# Patient Record
Sex: Male | Born: 1950
Health system: Southern US, Community
[De-identification: ages and names within clinical notes are randomized; demographics above are authoritative.]

## PROBLEM LIST (undated history)

## (undated) DIAGNOSIS — Z86010 Personal history of colonic polyps: Secondary | ICD-10-CM

## (undated) DIAGNOSIS — Z8719 Personal history of other diseases of the digestive system: Secondary | ICD-10-CM

## (undated) DIAGNOSIS — J45909 Unspecified asthma, uncomplicated: Secondary | ICD-10-CM

## (undated) DIAGNOSIS — M5030 Other cervical disc degeneration, unspecified cervical region: Secondary | ICD-10-CM

## (undated) DIAGNOSIS — M47812 Spondylosis without myelopathy or radiculopathy, cervical region: Secondary | ICD-10-CM

## (undated) DIAGNOSIS — C61 Malignant neoplasm of prostate: Secondary | ICD-10-CM

## (undated) DIAGNOSIS — D649 Anemia, unspecified: Secondary | ICD-10-CM

## (undated) HISTORY — PX: NO PAST SURGERIES: SHX2092

## (undated) HISTORY — PX: UPPER GI ENDOSCOPY: SHX6162

## (undated) HISTORY — PX: COLONOSCOPY: SHX174

---

## 2012-07-31 ENCOUNTER — Emergency Department (HOSPITAL_COMMUNITY): Payer: No Typology Code available for payment source

## 2012-07-31 ENCOUNTER — Encounter (HOSPITAL_COMMUNITY): Payer: Self-pay | Admitting: *Deleted

## 2012-07-31 ENCOUNTER — Emergency Department (HOSPITAL_COMMUNITY)
Admission: EM | Admit: 2012-07-31 | Discharge: 2012-08-01 | Disposition: A | Discharge status: Home / Self Care | Payer: No Typology Code available for payment source | Attending: Emergency Medicine | Admitting: Emergency Medicine

## 2012-07-31 DIAGNOSIS — R209 Unspecified disturbances of skin sensation: Secondary | ICD-10-CM | POA: Insufficient documentation

## 2012-07-31 DIAGNOSIS — Y92410 Unspecified street and highway as the place of occurrence of the external cause: Secondary | ICD-10-CM | POA: Insufficient documentation

## 2012-07-31 DIAGNOSIS — M542 Cervicalgia: Secondary | ICD-10-CM

## 2012-07-31 DIAGNOSIS — S0993XA Unspecified injury of face, initial encounter: Secondary | ICD-10-CM | POA: Insufficient documentation

## 2012-07-31 DIAGNOSIS — Y9389 Activity, other specified: Secondary | ICD-10-CM | POA: Insufficient documentation

## 2012-07-31 MED ORDER — CYCLOBENZAPRINE HCL 10 MG PO TABS: 10.0000 mg | ORAL_TABLET | Freq: Two times a day (BID) | ORAL | Status: DC | PRN
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
## 2012-08-01 NOTE — ED Provider Notes (Signed)
Medical screening examination/treatment/procedure(s) were performed by non-physician practitioner and as supervising physician I was immediately available for consultation/collaboration.  Flint Melter, MD 08/01/12 [phone]

## 2012-09-03 ENCOUNTER — Encounter (INDEPENDENT_AMBULATORY_CARE_PROVIDER_SITE_OTHER): Payer: Self-pay | Admitting: Surgery

## 2012-09-05 ENCOUNTER — Telehealth (INDEPENDENT_AMBULATORY_CARE_PROVIDER_SITE_OTHER): Payer: Self-pay | Admitting: Surgery

## 2012-09-05 ENCOUNTER — Encounter (INDEPENDENT_AMBULATORY_CARE_PROVIDER_SITE_OTHER): Payer: Self-pay | Admitting: Surgery

## 2012-09-05 ENCOUNTER — Ambulatory Visit (INDEPENDENT_AMBULATORY_CARE_PROVIDER_SITE_OTHER): Payer: BC Managed Care – PPO | Admitting: Surgery

## 2012-09-05 VITALS — BP 116/80 | HR 68 | Temp 97.2°F | Resp 14 | Ht 69.0 in | Wt 168.2 lb

## 2012-09-05 DIAGNOSIS — K4090 Unilateral inguinal hernia, without obstruction or gangrene, not specified as recurrent: Secondary | ICD-10-CM

## 2012-09-05 NOTE — Telephone Encounter (Signed)
Pt made aware financial responsibility.

## 2012-09-05 NOTE — Progress Notes (Signed)
Patient ID: Joseph Kane, male   DOB: 1950/03/09, 62 y.o.   MRN: [id number]  Chief Complaint  Patient presents with  . New Evaluation    acutr rt groin hernia    HPI Joseph Kane is a 62 y.o. male.  Referred by Dr. Mirna Mires for evaluation of right inguinal hernia   HPI This is a very healthy 62 year old male who presents with several months of an enlarging bulge in his right groin. The patient works at an [employer] and does a lot of heavy lifting. This causes some mild discomfort. He has fashioned a homemade truss to try to put pressure on this area to help keep it in. He has no pain when the area is reduced. He denies any obstructive symptoms. He presents now for surgical evaluation.  History reviewed. No pertinent past medical history.  History reviewed. No pertinent past surgical history.  Family History  Problem Relation Age of Onset  . Stroke Mother   . Cancer Father     throat    Social History History  Substance Use Topics  . Smoking status: Former Smoker    Quit date: 09/06/1982  . Smokeless tobacco: Never Used  . Alcohol Use: No    Allergies  Allergen Reactions  . Penicillins Other (See Comments)    Patient Blacks out    Current Outpatient Prescriptions  Medication Sig Dispense Refill  . cyclobenzaprine (FLEXERIL) 10 MG tablet Take 1 tablet (10 mg total) by mouth 2 (two) times daily as needed for muscle spasms.  10 tablet  0  . fish oil-omega-3 fatty acids 1000 MG capsule Take 1 g by mouth every other day.      . Multiple Vitamin (MULTIVITAMIN WITH MINERALS) TABS Take 1 tablet by mouth every other day.      Marland Kitchen OVER THE COUNTER MEDICATION Take 1 each by mouth daily. Gummy vitamin for prostate health      . OVER THE COUNTER MEDICATION        No current facility-administered medications for this visit.    Review of Systems Review of Systems  Constitutional: Negative for fever, chills and unexpected weight change.  HENT: Negative for hearing loss,  congestion, sore throat, trouble swallowing and voice change.   Eyes: Negative for visual disturbance.  Respiratory: Negative for cough and wheezing.   Cardiovascular: Negative for chest pain, palpitations and leg swelling.  Gastrointestinal: Negative for nausea, vomiting, abdominal pain, diarrhea, constipation, blood in stool, abdominal distention, anal bleeding and rectal pain.  Genitourinary: Positive for scrotal swelling. Negative for hematuria and difficulty urinating.  Musculoskeletal: Negative for arthralgias.  Skin: Negative for rash and wound.  Neurological: Negative for seizures, syncope, weakness and headaches.  Hematological: Negative for adenopathy. Does not bruise/bleed easily.  Psychiatric/Behavioral: Negative for confusion.    Blood pressure 116/80, pulse 68, temperature 97.2 F (36.2 C), temperature source Temporal, resp. rate 14, height 5\' 9"  (1.753 m), weight 168 lb 3.2 oz (76.295 kg).  Physical Exam Physical Exam WDWN in NAD HEENT:  EOMI, sclera anicteric Neck:  No masses, no thyromegaly Lungs:  CTA bilaterally; normal respiratory effort CV:  Regular rate and rhythm; no murmurs Abd:  +bowel sounds, soft, non-tender, no masses GU:  Bilateral descended testes; large right inguinal hernia extending down to scrotum; no sign of left inguinal hernia Ext:  Well-perfused; no edema Skin:  Warm, dry; no sign of jaundice  Data Reviewed none  Assessment    Right inguinal hernia - reducible  Plan    Right inguinal hernia repair with mesh.  The surgical procedure has been discussed with the patient.  Potential risks, benefits, alternative treatments, and expected outcomes have been explained.  All of the patient's questions at this time have been answered.  The likelihood of reaching the patient's treatment goal is good.  The patient understand the proposed surgical procedure and wishes to proceed.         Ladaja Yusupov K. 09/05/2012, 10:30 AM

## 2013-01-02 ENCOUNTER — Ambulatory Visit (INDEPENDENT_AMBULATORY_CARE_PROVIDER_SITE_OTHER): Payer: BC Managed Care – PPO | Admitting: Surgery

## 2013-01-02 ENCOUNTER — Encounter (INDEPENDENT_AMBULATORY_CARE_PROVIDER_SITE_OTHER): Payer: Self-pay | Admitting: Surgery

## 2013-01-02 ENCOUNTER — Encounter (INDEPENDENT_AMBULATORY_CARE_PROVIDER_SITE_OTHER): Payer: Self-pay

## 2013-01-02 VITALS — BP 138/82 | HR 84 | Resp 20 | Ht 69.0 in | Wt 179.2 lb

## 2013-01-02 DIAGNOSIS — K4090 Unilateral inguinal hernia, without obstruction or gangrene, not specified as recurrent: Secondary | ICD-10-CM

## 2013-01-02 NOTE — Progress Notes (Signed)
HPI  Joseph Kane is a 62 y.o. male. Referred by Dr. Mirna Mires for evaluation of right inguinal hernia   HPI  This is a very healthy 62 year old male who presents with several months of an enlarging bulge in his right groin. The patient works at an [employer] and does a lot of heavy lifting. This causes some mild discomfort. He has fashioned a homemade truss to try to put pressure on this area to help keep it in. He has no pain when the area is reduced. He denies any obstructive symptoms. He presents now for surgical evaluation.   PMH:  none  History reviewed. No pertinent past surgical history.   Family History   Problem  Relation  Age of Onset   .  Stroke  Mother    .  Cancer  Father      throat   Social History  History   Substance Use Topics   .  Smoking status:  Former Smoker     Quit date:  09/06/1982   .  Smokeless tobacco:  Never Used   .  Alcohol Use:  No    Allergies   Allergen  Reactions   .  Penicillins  Other (See Comments)     Patient Blacks out    Current Outpatient Prescriptions   Medication  Sig  Dispense  Refill   .  cyclobenzaprine (FLEXERIL) 10 MG tablet  Take 1 tablet (10 mg total) by mouth 2 (two) times daily as needed for muscle spasms.  10 tablet  0   .  fish oil-omega-3 fatty acids 1000 MG capsule  Take 1 g by mouth every other day.     .  Multiple Vitamin (MULTIVITAMIN WITH MINERALS) TABS  Take 1 tablet by mouth every other day.     Marland Kitchen  OVER THE COUNTER MEDICATION  Take 1 each by mouth daily. Gummy vitamin for prostate health     .  OVER THE COUNTER MEDICATION       No current facility-administered medications for this visit.   Review of Systems  Review of Systems  Constitutional: Negative for fever, chills and unexpected weight change.  HENT: Negative for hearing loss, congestion, sore throat, trouble swallowing and voice change.  Eyes: Negative for visual disturbance.  Respiratory: Negative for cough and wheezing.  Cardiovascular: Negative  for chest pain, palpitations and leg swelling.  Gastrointestinal: Negative for nausea, vomiting, abdominal pain, diarrhea, constipation, blood in stool, abdominal distention, anal bleeding and rectal pain.  Genitourinary: Positive for scrotal swelling. Negative for hematuria and difficulty urinating.  Musculoskeletal: Negative for arthralgias.  Skin: Negative for rash and wound.  Neurological: Negative for seizures, syncope, weakness and headaches.  Hematological: Negative for adenopathy. Does not bruise/bleed easily.  Psychiatric/Behavioral: Negative for confusion.  Blood pressure 116/80, pulse 68, temperature 97.2 F (36.2 C), temperature source Temporal, resp. rate 14, height 5\' 9"  (1.753 m), weight 168 lb 3.2 oz (76.295 kg).  Physical Exam  Physical Exam  WDWN in NAD  HEENT: EOMI, sclera anicteric  Neck: No masses, no thyromegaly  Lungs: CTA bilaterally; normal respiratory effort  CV: Regular rate and rhythm; no murmurs  Abd: +bowel sounds, soft, non-tender, no masses  GU: Bilateral descended testes; large right inguinal hernia extending down to scrotum; no sign of left inguinal hernia  Ext: Well-perfused; no edema  Skin: Warm, dry; no sign of jaundice  Data Reviewed  none  Assessment  Right inguinal hernia - reducible  Plan  Right inguinal  hernia repair with mesh. The surgical procedure has been discussed with the patient. Potential risks, benefits, alternative treatments, and expected outcomes have been explained. All of the patient's questions at this time have been answered. The likelihood of reaching the patient's treatment goal is good. The patient understand the proposed surgical procedure and wishes to proceed.    Joseph Kane. Joseph Skains, MD, [facility] Surgery  General/ Trauma Surgery  01/02/2013 9:44 AM

## 2013-01-12 ENCOUNTER — Encounter (HOSPITAL_COMMUNITY): Payer: Self-pay

## 2013-01-12 ENCOUNTER — Encounter (HOSPITAL_COMMUNITY)
Admission: RE | Admit: 2013-01-12 | Discharge: 2013-01-12 | Disposition: A | Discharge status: Home / Self Care | Payer: BC Managed Care – PPO | Source: Ambulatory Visit | Attending: Surgery | Admitting: Surgery

## 2013-01-12 HISTORY — DX: Unspecified asthma, uncomplicated: J45.909

## 2013-01-12 LAB — CBC
HCT: 39.1 % (ref 39.0–52.0)
Hemoglobin: 13 g/dL (ref 13.0–17.0)
WBC: 5.1 10*3/uL (ref 4.0–10.5)

## 2013-01-12 NOTE — Pre-Procedure Instructions (Signed)
Joseph Kane  01/12/2013   Your procedure is scheduled on:  Wednesday, January 14, 2013  Report to [facility] Short Stay (use Main Entrance "A'') at 1:30 PM.  Call this number if you have problems the morning of surgery: [phone]   Remember:   Do not eat food or drink liquids after midnight.   Take these medicines the morning of surgery with A SIP OF WATER: None Stop taking Aspirin, Multiple Vitamin (MULTIVITAMIN WITH MINERALS) TABS and herbal medications (fish oil-omega-3 fatty acids 1000 MG capsule)  Do not take any NSAIDs ie: Ibuprofen, Advil, Naproxen or any medication containing Aspirin.  Do not wear jewelry, make-up or nail polish.  Do not wear lotions, powders, or perfumes. You may NOT wear deodorant.  Do not shave 48 hours prior to surgery. Men may shave face and neck.  Do not bring valuables to the hospital.  [facility] is not responsible for any belongings or valuables.               Contacts, dentures or bridgework may not be worn into surgery.  Leave suitcase in the car. After surgery it may be brought to your room.  For patients admitted to the hospital, discharge time is determined by your treatment team.               Patients discharged the day of surgery will not be allowed to drive home.  Name and phone number of your driver:   Special Instructions: Shower using CHG 2 nights before surgery and the night before surgery.  If you shower the day of surgery use CHG.  Use special wash - you have one bottle of CHG for all showers.  You should use approximately 1/3 of the bottle for each shower.   Please read over the following fact sheets that you were given: Pain Booklet, Coughing and Deep Breathing and Surgical Site Infection Prevention

## 2013-01-12 NOTE — Progress Notes (Signed)
Pt denies SOB, chest pain, and being under the care of a cardiologist. Pt PCP,  Dr. Loleta Chance of [facility] made aware of pt positive STOP-BangAassessment Tool results. Stress test and EKG results request from PCP.

## 2013-01-12 NOTE — Progress Notes (Signed)
01/12/13 1129  OBSTRUCTIVE SLEEP APNEA  Have you ever been diagnosed with sleep apnea through a sleep study? No  Do you snore loudly (loud enough to be heard through closed doors)?  1  Do you often feel tired, fatigued, or sleepy during the daytime? 0  Has anyone observed you stop breathing during your sleep? 1  Do you have, or are you being treated for high blood pressure? 0  BMI more than 35 kg/m2? 0  Age over 61 years old? 1  Neck circumference greater than 40 cm/18 inches? 0  Gender: 1  Obstructive Sleep Apnea Score 4  Score 4 or greater  Results sent to PCP

## 2013-01-13 ENCOUNTER — Encounter (HOSPITAL_COMMUNITY): Payer: Self-pay | Admitting: Pharmacy Technician

## 2013-01-13 MED ORDER — CHLORHEXIDINE GLUCONATE 4 % EX LIQD: 1.0000 "application " | Freq: Once | CUTANEOUS | Status: DC

## 2013-01-13 MED ORDER — VANCOMYCIN HCL IN DEXTROSE 1-5 GM/200ML-% IV SOLN
1000.0000 mg | INTRAVENOUS | Status: AC
  Administered 2013-01-14: 1000 mg via INTRAVENOUS
  Filled 2013-01-13: qty 200

## 2013-01-13 NOTE — Progress Notes (Signed)
Anesthesia Chart Review:  Patient is a 62 year old male scheduled for right IHR on 01/14/13 by Dr. [name].    History includes former smoker, asthma. OSA screening score was 4 or greater.  PCP is Dr. Loleta Chance of the [facility] who referred patient to [facility] for treatment of his inguinal hernia.  Patient denied chest pain and SOB during his PAT visit.  He had PFTs, cardiopulmonary exercise test on 09/14/10 at the [facility].  Spirometry and diffusion were WNL. By notes, baseline EKG showed NSR without ST changes noted at peak HR, occasional PACs and PVCs noted. Functional status was mildly reduced. Cardiovascular status was documented as impaired. Peak VO2 = 79% predicted, and patient was asymptomatic other than leg fatigue so aggressive medical and exercise therapy were recommended.  Overall risk assessment for high risk surgery was LOW.    Dr. Adaline Sill office does not actually have an EKG copy on file at their office.  Although it appears his stress test was overall low risk, I will order an EKG for baseline since CV status was marked as "impaired" and PVCs were documented.  Preoperative CBC noted.  (He had a normal BMET and liver profile on 08/21/12 at the [facility].)  I have reviewed stress test report with Dr,. Hodierne. Patient has no history of known DM, CHF, MI, HTN.  He denied CV symptoms, and it appears that the overall impression of his cardiopulmonary exercise test over two years ago showed low risk for a high risk surgery (which inguinal hernia repair is not).  If his EKG shows no worrisome findings then I would anticipate that he could proceed as planned. Further evaluation by his assigned anesthesiologist on the day of surgery.    Velna Ochs [facility] Short Stay Center/Anesthesiology Phone [phone] 01/13/2013 10:46 AM

## 2013-01-14 ENCOUNTER — Encounter (HOSPITAL_COMMUNITY)
Admission: RE | Disposition: A | Discharge status: Home / Self Care | Payer: Self-pay | Source: Ambulatory Visit | Attending: Surgery

## 2013-01-14 ENCOUNTER — Ambulatory Visit (HOSPITAL_COMMUNITY): Payer: BC Managed Care – PPO | Admitting: Certified Registered"

## 2013-01-14 ENCOUNTER — Encounter (HOSPITAL_COMMUNITY): Payer: BC Managed Care – PPO | Admitting: Vascular Surgery

## 2013-01-14 ENCOUNTER — Ambulatory Visit (HOSPITAL_COMMUNITY)
Admission: RE | Admit: 2013-01-14 | Discharge: 2013-01-14 | Disposition: A | Discharge status: Home / Self Care | Payer: BC Managed Care – PPO | Source: Ambulatory Visit | Attending: Surgery | Admitting: Surgery

## 2013-01-14 DIAGNOSIS — Z01812 Encounter for preprocedural laboratory examination: Secondary | ICD-10-CM | POA: Insufficient documentation

## 2013-01-14 DIAGNOSIS — K4090 Unilateral inguinal hernia, without obstruction or gangrene, not specified as recurrent: Secondary | ICD-10-CM

## 2013-01-14 DIAGNOSIS — Z87891 Personal history of nicotine dependence: Secondary | ICD-10-CM | POA: Insufficient documentation

## 2013-01-14 HISTORY — PX: INGUINAL HERNIA REPAIR: SHX194

## 2013-01-14 HISTORY — PX: INSERTION OF MESH: SHX5868

## 2013-01-14 SURGERY — REPAIR, HERNIA, INGUINAL, ADULT
Anesthesia: General | Site: Groin | Laterality: Right

## 2013-01-14 MED ORDER — BUPIVACAINE-EPINEPHRINE (PF) 0.25% -1:200000 IJ SOLN
INTRAMUSCULAR | Status: AC
  Filled 2013-01-14: qty 30

## 2013-01-14 MED ORDER — MIDAZOLAM HCL 5 MG/5ML IJ SOLN
INTRAMUSCULAR | Status: DC | PRN
  Administered 2013-01-14: 2 mg via INTRAVENOUS

## 2013-01-14 MED ORDER — NEOSTIGMINE METHYLSULFATE 1 MG/ML IJ SOLN
INTRAMUSCULAR | Status: DC | PRN
  Administered 2013-01-14: 4 mg via INTRAVENOUS

## 2013-01-14 MED ORDER — MEPERIDINE HCL 25 MG/ML IJ SOLN: 6.2500 mg | INTRAMUSCULAR | Status: DC | PRN

## 2013-01-14 MED ORDER — FENTANYL CITRATE 0.05 MG/ML IJ SOLN
INTRAMUSCULAR | Status: DC | PRN
  Administered 2013-01-14: 100 ug via INTRAVENOUS
  Administered 2013-01-14: 50 ug via INTRAVENOUS

## 2013-01-14 MED ORDER — OXYCODONE-ACETAMINOPHEN 5-325 MG PO TABS: 1.0000 | ORAL_TABLET | ORAL | Status: DC | PRN

## 2013-01-14 MED ORDER — FENTANYL CITRATE 0.05 MG/ML IJ SOLN
INTRAMUSCULAR | Status: AC
  Administered 2013-01-14: 50 ug via INTRAVENOUS
  Filled 2013-01-14: qty 2

## 2013-01-14 MED ORDER — FENTANYL CITRATE 0.05 MG/ML IJ SOLN
25.0000 ug | INTRAMUSCULAR | Status: DC | PRN
  Administered 2013-01-14: 50 ug via INTRAVENOUS

## 2013-01-14 MED ORDER — PROPOFOL 10 MG/ML IV BOLUS
INTRAVENOUS | Status: DC | PRN
  Administered 2013-01-14: 20 mg via INTRAVENOUS
  Administered 2013-01-14: 200 mg via INTRAVENOUS

## 2013-01-14 MED ORDER — LACTATED RINGERS IV SOLN
INTRAVENOUS | Status: DC | PRN
  Administered 2013-01-14: 14:00:00 via INTRAVENOUS

## 2013-01-14 MED ORDER — LIDOCAINE HCL (CARDIAC) 20 MG/ML IV SOLN
INTRAVENOUS | Status: DC | PRN
  Administered 2013-01-14: 60 mg via INTRAVENOUS

## 2013-01-14 MED ORDER — BUPIVACAINE-EPINEPHRINE 0.25% -1:200000 IJ SOLN
INTRAMUSCULAR | Status: DC | PRN
  Administered 2013-01-14: 30 mL

## 2013-01-14 MED ORDER — ONDANSETRON HCL 4 MG/2ML IJ SOLN
INTRAMUSCULAR | Status: DC | PRN
  Administered 2013-01-14: 4 mg via INTRAVENOUS

## 2013-01-14 MED ORDER — GLYCOPYRROLATE 0.2 MG/ML IJ SOLN
INTRAMUSCULAR | Status: DC | PRN
  Administered 2013-01-14: 0.6 mg via INTRAVENOUS

## 2013-01-14 MED ORDER — ROCURONIUM BROMIDE 100 MG/10ML IV SOLN
INTRAVENOUS | Status: DC | PRN
  Administered 2013-01-14: 35 mg via INTRAVENOUS

## 2013-01-14 MED ORDER — PROMETHAZINE HCL 25 MG/ML IJ SOLN: 6.2500 mg | INTRAMUSCULAR | Status: DC | PRN

## 2013-01-14 MED ORDER — ONDANSETRON HCL 4 MG/2ML IJ SOLN: 4.0000 mg | INTRAMUSCULAR | Status: DC | PRN

## 2013-01-14 MED ORDER — KETOROLAC TROMETHAMINE 30 MG/ML IJ SOLN
INTRAMUSCULAR | Status: DC | PRN
  Administered 2013-01-14: 30 mg via INTRAVENOUS

## 2013-01-14 MED ORDER — MORPHINE SULFATE 2 MG/ML IJ SOLN: 2.0000 mg | INTRAMUSCULAR | Status: DC | PRN

## 2013-01-14 MED ORDER — LACTATED RINGERS IV SOLN
INTRAVENOUS | Status: DC
  Administered 2013-01-14: 13:00:00 via INTRAVENOUS

## 2013-01-14 MED ORDER — 0.9 % SODIUM CHLORIDE (POUR BTL) OPTIME
TOPICAL | Status: DC | PRN
  Administered 2013-01-14: 1000 mL

## 2013-01-14 MED ORDER — LACTATED RINGERS IV SOLN: INTRAVENOUS | Status: DC

## 2013-01-14 SURGICAL SUPPLY — 49 items
BENZOIN TINCTURE PRP APPL 2/3 (GAUZE/BANDAGES/DRESSINGS) ×2 IMPLANT
BLADE SURG 15 STRL LF DISP TIS (BLADE) ×1 IMPLANT
BLADE SURG 15 STRL SS (BLADE) ×1
BLADE SURG ROTATE 9660 (MISCELLANEOUS) ×2 IMPLANT
CHLORAPREP W/TINT 26ML (MISCELLANEOUS) ×2 IMPLANT
COVER SURGICAL LIGHT HANDLE (MISCELLANEOUS) ×2 IMPLANT
DECANTER SPIKE VIAL GLASS SM (MISCELLANEOUS) ×2 IMPLANT
DRAIN PENROSE 1/2X12 LTX STRL (WOUND CARE) ×2 IMPLANT
DRAPE LAPAROSCOPIC ABDOMINAL (DRAPES) IMPLANT
DRAPE LAPAROTOMY TRNSV 102X78 (DRAPE) ×2 IMPLANT
DRAPE UTILITY 15X26 W/TAPE STR (DRAPE) ×4 IMPLANT
DRSG TEGADERM 4X4.75 (GAUZE/BANDAGES/DRESSINGS) ×2 IMPLANT
ELECT CAUTERY BLADE 6.4 (BLADE) ×2 IMPLANT
ELECT REM PT RETURN 9FT ADLT (ELECTROSURGICAL) ×2
ELECTRODE REM PT RTRN 9FT ADLT (ELECTROSURGICAL) ×1 IMPLANT
GAUZE SPONGE 4X4 16PLY XRAY LF (GAUZE/BANDAGES/DRESSINGS) ×2 IMPLANT
GLOVE BIO SURGEON STRL SZ7 (GLOVE) ×2 IMPLANT
GLOVE BIOGEL PI IND STRL 7.0 (GLOVE) ×1 IMPLANT
GLOVE BIOGEL PI IND STRL 7.5 (GLOVE) ×1 IMPLANT
GLOVE BIOGEL PI INDICATOR 7.0 (GLOVE) ×1
GLOVE BIOGEL PI INDICATOR 7.5 (GLOVE) ×1
GLOVE SURG SS PI 7.0 STRL IVOR (GLOVE) ×2 IMPLANT
GOWN STRL NON-REIN LRG LVL3 (GOWN DISPOSABLE) ×6 IMPLANT
KIT BASIN OR (CUSTOM PROCEDURE TRAY) ×2 IMPLANT
KIT ROOM TURNOVER OR (KITS) ×2 IMPLANT
MESH PARIETEX PROGRIP RIGHT (Mesh General) ×2 IMPLANT
NEEDLE HYPO 25GX1X1/2 BEV (NEEDLE) ×2 IMPLANT
NS IRRIG 1000ML POUR BTL (IV SOLUTION) ×2 IMPLANT
PACK SURGICAL SETUP 50X90 (CUSTOM PROCEDURE TRAY) ×2 IMPLANT
PAD ARMBOARD 7.5X6 YLW CONV (MISCELLANEOUS) ×2 IMPLANT
PENCIL BUTTON HOLSTER BLD 10FT (ELECTRODE) ×2 IMPLANT
SPECIMEN JAR SMALL (MISCELLANEOUS) IMPLANT
SPONGE GAUZE 4X4 12PLY (GAUZE/BANDAGES/DRESSINGS) ×2 IMPLANT
SPONGE INTESTINAL PEANUT (DISPOSABLE) ×2 IMPLANT
STRIP CLOSURE SKIN 1/2X4 (GAUZE/BANDAGES/DRESSINGS) ×2 IMPLANT
SUT MNCRL AB 4-0 PS2 18 (SUTURE) ×2 IMPLANT
SUT PDS AB 0 CT 36 (SUTURE) IMPLANT
SUT PROLENE 2 0 SH DA (SUTURE) ×4 IMPLANT
SUT SILK 2 0 SH (SUTURE) IMPLANT
SUT SILK 3 0 (SUTURE)
SUT SILK 3-0 18XBRD TIE 12 (SUTURE) IMPLANT
SUT VIC AB 0 CT2 27 (SUTURE) ×2 IMPLANT
SUT VIC AB 2-0 SH 27 (SUTURE) ×1
SUT VIC AB 2-0 SH 27X BRD (SUTURE) ×1 IMPLANT
SUT VIC AB 3-0 SH 27 (SUTURE) ×1
SUT VIC AB 3-0 SH 27XBRD (SUTURE) ×1 IMPLANT
SYR CONTROL 10ML LL (SYRINGE) ×2 IMPLANT
TOWEL OR 17X24 6PK STRL BLUE (TOWEL DISPOSABLE) ×2 IMPLANT
TOWEL OR 17X26 10 PK STRL BLUE (TOWEL DISPOSABLE) ×2 IMPLANT

## 2013-01-14 NOTE — Progress Notes (Signed)
Report given to Ashrith Sagan rn as caregiver 

## 2013-01-14 NOTE — Anesthesia Procedure Notes (Addendum)
Procedure Name: Intubation Date/Time: 01/14/2013 2:27 PM Performed by: Ellin Goodie Pre-anesthesia Checklist: Patient identified, Emergency Drugs available, Suction available, Patient being monitored and Timeout performed Patient Re-evaluated:Patient Re-evaluated prior to inductionOxygen Delivery Method: Circle system utilized Preoxygenation: Pre-oxygenation with 100% oxygen Intubation Type: IV induction Ventilation: Mask ventilation without difficulty and Oral airway inserted - appropriate to patient size Laryngoscope Size: Mac and 3 Grade View: Grade I Tube type: Oral Tube size: 7.5 mm Number of attempts: 1 Airway Equipment and Method: Stylet Placement Confirmation: ETT inserted through vocal cords under direct vision,  positive ETCO2 and breath sounds checked- equal and bilateral Secured at: 22 cm Tube secured with: Tape Dental Injury: Teeth and Oropharynx as per pre-operative assessment  Comments: Easy atraumatic induction and intubation with MAC 3 blade.  Dr. Acey Lav verified placement of ETT.  Carlynn Herald, CRNA

## 2013-01-14 NOTE — H&P (View-Only) (Signed)
HPI  Joseph Kane is a 62 y.o. male. Referred by Dr. Gerald Hill for evaluation of right inguinal hernia   HPI  This is a very healthy 62-year-old male who presents with several months of an enlarging bulge in his right groin. The patient works at an [employer] and does a lot of heavy lifting. This causes some mild discomfort. He has fashioned a homemade truss to try to put pressure on this area to help keep it in. He has no pain when the area is reduced. He denies any obstructive symptoms. He presents now for surgical evaluation.   PMH:  none  History reviewed. No pertinent past surgical history.   Family History   Problem  Relation  Age of Onset   .  Stroke  Mother    .  Cancer  Father      throat   Social History  History   Substance Use Topics   .  Smoking status:  Former Smoker     Quit date:  09/06/1982   .  Smokeless tobacco:  Never Used   .  Alcohol Use:  No    Allergies   Allergen  Reactions   .  Penicillins  Other (See Comments)     Patient Blacks out    Current Outpatient Prescriptions   Medication  Sig  Dispense  Refill   .  cyclobenzaprine (FLEXERIL) 10 MG tablet  Take 1 tablet (10 mg total) by mouth 2 (two) times daily as needed for muscle spasms.  10 tablet  0   .  fish oil-omega-3 fatty acids 1000 MG capsule  Take 1 g by mouth every other day.     .  Multiple Vitamin (MULTIVITAMIN WITH MINERALS) TABS  Take 1 tablet by mouth every other day.     .  OVER THE COUNTER MEDICATION  Take 1 each by mouth daily. Gummy vitamin for prostate health     .  OVER THE COUNTER MEDICATION       No current facility-administered medications for this visit.   Review of Systems  Review of Systems  Constitutional: Negative for fever, chills and unexpected weight change.  HENT: Negative for hearing loss, congestion, sore throat, trouble swallowing and voice change.  Eyes: Negative for visual disturbance.  Respiratory: Negative for cough and wheezing.  Cardiovascular: Negative  for chest pain, palpitations and leg swelling.  Gastrointestinal: Negative for nausea, vomiting, abdominal pain, diarrhea, constipation, blood in stool, abdominal distention, anal bleeding and rectal pain.  Genitourinary: Positive for scrotal swelling. Negative for hematuria and difficulty urinating.  Musculoskeletal: Negative for arthralgias.  Skin: Negative for rash and wound.  Neurological: Negative for seizures, syncope, weakness and headaches.  Hematological: Negative for adenopathy. Does not bruise/bleed easily.  Psychiatric/Behavioral: Negative for confusion.  Blood pressure 116/80, pulse 68, temperature 97.2 F (36.2 C), temperature source Temporal, resp. rate 14, height 5' 9" (1.753 m), weight 168 lb 3.2 oz (76.295 kg).  Physical Exam  Physical Exam  WDWN in NAD  HEENT: EOMI, sclera anicteric  Neck: No masses, no thyromegaly  Lungs: CTA bilaterally; normal respiratory effort  CV: Regular rate and rhythm; no murmurs  Abd: +bowel sounds, soft, non-tender, no masses  GU: Bilateral descended testes; large right inguinal hernia extending down to scrotum; no sign of left inguinal hernia  Ext: Well-perfused; no edema  Skin: Warm, dry; no sign of jaundice  Data Reviewed  none  Assessment  Right inguinal hernia - reducible  Plan  Right inguinal   hernia repair with mesh. The surgical procedure has been discussed with the patient. Potential risks, benefits, alternative treatments, and expected outcomes have been explained. All of the patient's questions at this time have been answered. The likelihood of reaching the patient's treatment goal is good. The patient understand the proposed surgical procedure and wishes to proceed.    [name] [name], MD, FACS [facility]  General/ Trauma Surgery  01/02/2013 9:44 AM  

## 2013-01-14 NOTE — Preoperative (Signed)
Beta Blockers   Reason not to administer Beta Blockers:Not Applicable 

## 2013-01-14 NOTE — Anesthesia Postprocedure Evaluation (Signed)
  Anesthesia Post-op Note  Patient: Joseph Kane  Procedure(s) Performed: Procedure(s) (LRB): HERNIA REPAIR INGUINAL ADULT (Right) INSERTION OF MESH (Right)  Patient Location: PACU  Anesthesia Type: General  Level of Consciousness: awake and alert   Airway and Oxygen Therapy: Patient Spontanous Breathing  Post-op Pain: mild  Post-op Assessment: Post-op Vital signs reviewed, Patient's Cardiovascular Status Stable, Respiratory Function Stable, Patent Airway and No signs of Nausea or vomiting  Last Vitals:  Filed Vitals:   01/14/13 1530  BP: 131/79  Pulse: 73  Temp: 36 C  Resp: 17    Post-op Vital Signs: stable   Complications: No apparent anesthesia complications

## 2013-01-14 NOTE — Anesthesia Preprocedure Evaluation (Addendum)
Anesthesia Evaluation  Patient identified by MRN, date of birth, ID band Patient awake    Reviewed: Allergy & Precautions, H&P , NPO status , Patient's Chart, lab work & pertinent test results, reviewed documented beta blocker date and time   Airway Mallampati: I TM Distance: >3 FB Neck ROM: Full    Dental no notable dental hx. (+) Teeth Intact and Dental Advisory Given   Pulmonary former smoker,  breath sounds clear to auscultation  Pulmonary exam normal       Cardiovascular negative cardio ROS  Rhythm:Regular Rate:Normal     Neuro/Psych negative neurological ROS  negative psych ROS   GI/Hepatic negative GI ROS, Neg liver ROS,   Endo/Other  negative endocrine ROS  Renal/GU negative Renal ROS  negative genitourinary   Musculoskeletal negative musculoskeletal ROS (+)   Abdominal (+)  Abdomen: soft. Bowel sounds: normal.  Peds negative pediatric ROS (+)  Hematology negative hematology ROS (+)   Anesthesia Other Findings   Reproductive/Obstetrics negative OB ROS                        Anesthesia Physical Anesthesia Plan  ASA: I  Anesthesia Plan: General   Post-op Pain Management:    Induction: Intravenous  Airway Management Planned: LMA and Oral ETT  Additional Equipment:   Intra-op Plan:   Post-operative Plan: Extubation in OR  Informed Consent: I have reviewed the patients History and Physical, chart, labs and discussed the procedure including the risks, benefits and alternatives for the proposed anesthesia with the patient or authorized representative who has indicated his/her understanding and acceptance.   Dental advisory given  Plan Discussed with: CRNA  Anesthesia Plan Comments:         Anesthesia Quick Evaluation

## 2013-01-14 NOTE — Transfer of Care (Signed)
Immediate Anesthesia Transfer of Care Note  Patient: Joseph Kane  Procedure(s) Performed: Procedure(s): HERNIA REPAIR INGUINAL ADULT (Right) INSERTION OF MESH (Right)  Patient Location: PACU  Anesthesia Type:General  Level of Consciousness: awake, alert  and sedated  Airway & Oxygen Therapy: Patient connected to face mask oxygen  Post-op Assessment: Report given to PACU RN  Post vital signs: stable  Complications: No apparent anesthesia complications

## 2013-01-14 NOTE — Interval H&P Note (Signed)
History and Physical Interval Note:  01/14/2013 1:40 PM  Joseph Kane  has presented today for surgery, with the diagnosis of large right inguinal hernia   The various methods of treatment have been discussed with the patient and family. After consideration of risks, benefits and other options for treatment, the patient has consented to  Procedure(s): HERNIA REPAIR INGUINAL ADULT (Right) INSERTION OF MESH (Right) as a surgical intervention .  The patient's history has been reviewed, patient examined, no change in status, stable for surgery.  I have reviewed the patient's chart and labs.  Questions were answered to the patient's satisfaction.     Donja Tipping K.

## 2013-01-14 NOTE — Op Note (Signed)
Hernia, Open, Procedure Note  Indications: The patient presented with a history of a right, reducible inguinal hernia.    Pre-operative Diagnosis: right reducible inguinal hernia Post-operative Diagnosis: same  Surgeon: Wynona Luna.   Assistants: none  Anesthesia: General endotracheal anesthesia  ASA Class: 1  Procedure Details  The patient was seen again in the Holding Room. The risks, benefits, complications, treatment options, and expected outcomes were discussed with the patient. The possibilities of reaction to medication, pulmonary aspiration, perforation of viscus, bleeding, recurrent infection, the need for additional procedures, and development of a complication requiring transfusion or further operation were discussed with the patient and/or family. The likelihood of success in repairing the hernia and returning the patient to their previous functional status is good.  There was concurrence with the proposed plan, and informed consent was obtained. The site of surgery was properly noted/marked. The patient was taken to the Operating Room, identified as Sriyan Cutting, and the procedure verified as right inguinal hernia repair. A Time Out was held and the above information confirmed.  The patient was placed in the supine position and underwent induction of anesthesia. The lower abdomen and groin was prepped with Chloraprep and draped in the standard fashion, and 0.25% Marcaine with epinephrine was used to anesthetize the skin over the mid-portion of the inguinal canal. An oblique incision was made. Dissection was carried down through the subcutaneous tissue with cautery to the external oblique fascia.  We opened the external oblique fascia along the direction of its fibers to the external ring.  The spermatic cord was circumferentially dissected bluntly and retracted with a Penrose drain.  The ilioinguinal nerve was identified and preserved.  The floor of the inguinal canal was  inspected and was intact.  We skeletonized the spermatic cord and reduced a large indirect hernia sac.  We used a right-sided Progrip mesh which was inserted and deployed across the floor of the inguinal canal. The mesh was tucked underneath the external oblique fascia laterally.  The flap of the mesh was closed around the spermatic cord to recreate the internal inguinal ring.  The mesh was secured to the pubic tubercle with 0 Vicryl.  The external oblique fascia was reapproximated with 2-0 Vicryl.  3-0 Vicryl was used to close the subcutaneous tissues and 4-0 Monocryl was used to close the skin in subcuticular fashion.  Benzoin and steri-strips were used to seal the incision.  A clean dressing was applied.  The patient was then extubated and brought to the recovery room in stable condition.  All sponge, instrument, and needle counts were correct prior to closure and at the conclusion of the case.   Estimated Blood Loss: Minimal                 Complications: None; patient tolerated the procedure well.         Disposition: PACU - hemodynamically stable.         Condition: stable  [name]. [name], MD, [facility]  General/ Trauma Surgery  01/14/2013 3:30 PM

## 2013-01-16 ENCOUNTER — Encounter (HOSPITAL_COMMUNITY): Payer: Self-pay | Admitting: Surgery

## 2013-01-28 ENCOUNTER — Ambulatory Visit (INDEPENDENT_AMBULATORY_CARE_PROVIDER_SITE_OTHER): Payer: BC Managed Care – PPO | Admitting: Surgery

## 2013-01-28 ENCOUNTER — Encounter (INDEPENDENT_AMBULATORY_CARE_PROVIDER_SITE_OTHER): Payer: Self-pay | Admitting: Surgery

## 2013-01-28 VITALS — BP 125/83 | HR 68 | Temp 98.2°F | Resp 18 | Ht 69.0 in | Wt 177.6 lb

## 2013-01-28 DIAGNOSIS — K4090 Unilateral inguinal hernia, without obstruction or gangrene, not specified as recurrent: Secondary | ICD-10-CM

## 2013-01-28 NOTE — Progress Notes (Signed)
Status post right inguinal hernia repair with mesh on 01/14/13.  The patient had a large indirect hernia. He is doing quite well. Minimal swelling. His incision is healing well no sign of infection. No sign of recurrent hernia. He should continue limiting his level of activity for 2 more weeks. Follow up as needed.  [name]. [name], MD, [facility]  General/ Trauma Surgery  01/28/2013 2:17 PM

## 2013-02-03 ENCOUNTER — Encounter (INDEPENDENT_AMBULATORY_CARE_PROVIDER_SITE_OTHER): Payer: Self-pay | Admitting: Surgery

## 2013-02-03 ENCOUNTER — Other Ambulatory Visit (INDEPENDENT_AMBULATORY_CARE_PROVIDER_SITE_OTHER): Payer: Self-pay | Admitting: Surgery

## 2013-02-03 ENCOUNTER — Telehealth (INDEPENDENT_AMBULATORY_CARE_PROVIDER_SITE_OTHER): Payer: Self-pay | Admitting: *Deleted

## 2013-02-03 ENCOUNTER — Telehealth (INDEPENDENT_AMBULATORY_CARE_PROVIDER_SITE_OTHER): Payer: Self-pay | Admitting: General Surgery

## 2013-02-03 MED ORDER — OXYCODONE-ACETAMINOPHEN 5-325 MG PO TABS: 1.0000 | ORAL_TABLET | ORAL | Status: DC | PRN

## 2013-02-03 NOTE — Telephone Encounter (Signed)
Called patient wife to let her know that I have a Rx for her husband at the check in desk for percocet 5-325

## 2013-02-03 NOTE — Telephone Encounter (Signed)
Patient's wife called asking for a refill of pain medication.  She states that since patient has been back to work he has periods of increasing pain.  She denies that patient has any symptoms or signs of infection.  Explained that I would send a message to Dr. [name] to ask his opinion on this then we will let her know.  Wife states understanding at this time.

## 2015-07-10 ENCOUNTER — Encounter (HOSPITAL_COMMUNITY): Payer: Self-pay

## 2015-07-10 ENCOUNTER — Emergency Department (HOSPITAL_COMMUNITY): Payer: No Typology Code available for payment source

## 2015-07-10 ENCOUNTER — Emergency Department (HOSPITAL_COMMUNITY)
Admission: EM | Admit: 2015-07-10 | Discharge: 2015-07-10 | Disposition: A | Discharge status: Home / Self Care | Payer: No Typology Code available for payment source | Attending: Emergency Medicine | Admitting: Emergency Medicine

## 2015-07-10 DIAGNOSIS — Y92410 Unspecified street and highway as the place of occurrence of the external cause: Secondary | ICD-10-CM | POA: Insufficient documentation

## 2015-07-10 DIAGNOSIS — M7918 Myalgia, other site: Secondary | ICD-10-CM

## 2015-07-10 DIAGNOSIS — M545 Low back pain: Secondary | ICD-10-CM | POA: Insufficient documentation

## 2015-07-10 DIAGNOSIS — Z87891 Personal history of nicotine dependence: Secondary | ICD-10-CM | POA: Diagnosis not present

## 2015-07-10 DIAGNOSIS — Y9389 Activity, other specified: Secondary | ICD-10-CM | POA: Diagnosis not present

## 2015-07-10 DIAGNOSIS — Z79899 Other long term (current) drug therapy: Secondary | ICD-10-CM | POA: Insufficient documentation

## 2015-07-10 DIAGNOSIS — Z7982 Long term (current) use of aspirin: Secondary | ICD-10-CM | POA: Diagnosis not present

## 2015-07-10 DIAGNOSIS — Y999 Unspecified external cause status: Secondary | ICD-10-CM | POA: Insufficient documentation

## 2015-07-10 DIAGNOSIS — J45909 Unspecified asthma, uncomplicated: Secondary | ICD-10-CM | POA: Diagnosis not present

## 2015-07-10 MED ORDER — METHOCARBAMOL 500 MG PO TABS: 500.0000 mg | ORAL_TABLET | Freq: Two times a day (BID) | ORAL | Status: DC

## 2015-07-10 MED ORDER — NAPROXEN 250 MG PO TABS
500.0000 mg | ORAL_TABLET | Freq: Once | ORAL | Status: AC
  Administered 2015-07-10: 500 mg via ORAL
  Filled 2015-07-10: qty 2

## 2015-07-10 MED ORDER — METHOCARBAMOL 500 MG PO TABS
500.0000 mg | ORAL_TABLET | Freq: Once | ORAL | Status: AC
  Administered 2015-07-10: 500 mg via ORAL
  Filled 2015-07-10: qty 1

## 2015-07-10 MED ORDER — NAPROXEN 500 MG PO TABS: 500.0000 mg | ORAL_TABLET | Freq: Two times a day (BID) | ORAL | Status: DC
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
## 2016-04-16 ENCOUNTER — Encounter (HOSPITAL_COMMUNITY): Payer: Self-pay | Admitting: Emergency Medicine

## 2016-04-16 ENCOUNTER — Emergency Department (HOSPITAL_COMMUNITY)
Admission: EM | Admit: 2016-04-16 | Discharge: 2016-04-16 | Disposition: A | Discharge status: Home / Self Care | Payer: No Typology Code available for payment source | Attending: Emergency Medicine | Admitting: Emergency Medicine

## 2016-04-16 DIAGNOSIS — Y999 Unspecified external cause status: Secondary | ICD-10-CM | POA: Insufficient documentation

## 2016-04-16 DIAGNOSIS — Z7982 Long term (current) use of aspirin: Secondary | ICD-10-CM | POA: Insufficient documentation

## 2016-04-16 DIAGNOSIS — Y92410 Unspecified street and highway as the place of occurrence of the external cause: Secondary | ICD-10-CM | POA: Insufficient documentation

## 2016-04-16 DIAGNOSIS — Z79899 Other long term (current) drug therapy: Secondary | ICD-10-CM | POA: Diagnosis not present

## 2016-04-16 DIAGNOSIS — J45909 Unspecified asthma, uncomplicated: Secondary | ICD-10-CM | POA: Diagnosis not present

## 2016-04-16 DIAGNOSIS — Y939 Activity, unspecified: Secondary | ICD-10-CM | POA: Diagnosis not present

## 2016-04-16 DIAGNOSIS — Z87891 Personal history of nicotine dependence: Secondary | ICD-10-CM | POA: Insufficient documentation

## 2016-04-16 DIAGNOSIS — S29012A Strain of muscle and tendon of back wall of thorax, initial encounter: Secondary | ICD-10-CM | POA: Diagnosis not present

## 2016-04-16 DIAGNOSIS — M62838 Other muscle spasm: Secondary | ICD-10-CM

## 2016-04-16 DIAGNOSIS — S299XXA Unspecified injury of thorax, initial encounter: Secondary | ICD-10-CM | POA: Diagnosis present

## 2016-04-16 MED ORDER — CYCLOBENZAPRINE HCL 10 MG PO TABS: 10.0000 mg | ORAL_TABLET | Freq: Two times a day (BID) | ORAL | 0 refills | Status: DC | PRN

## 2016-04-16 MED ORDER — ACETAMINOPHEN 500 MG PO TABS: 1000.0000 mg | ORAL_TABLET | Freq: Three times a day (TID) | ORAL | 0 refills | Status: AC
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
## 2016-12-25 DIAGNOSIS — Z125 Encounter for screening for malignant neoplasm of prostate: Secondary | ICD-10-CM | POA: Diagnosis not present

## 2016-12-25 DIAGNOSIS — N529 Male erectile dysfunction, unspecified: Secondary | ICD-10-CM | POA: Diagnosis not present

## 2016-12-25 DIAGNOSIS — N400 Enlarged prostate without lower urinary tract symptoms: Secondary | ICD-10-CM | POA: Diagnosis not present

## 2017-01-08 DIAGNOSIS — R972 Elevated prostate specific antigen [PSA]: Secondary | ICD-10-CM | POA: Diagnosis not present

## 2017-01-14 DIAGNOSIS — E782 Mixed hyperlipidemia: Secondary | ICD-10-CM | POA: Diagnosis not present

## 2017-01-14 DIAGNOSIS — N400 Enlarged prostate without lower urinary tract symptoms: Secondary | ICD-10-CM | POA: Diagnosis not present

## 2017-01-14 DIAGNOSIS — Z1211 Encounter for screening for malignant neoplasm of colon: Secondary | ICD-10-CM | POA: Diagnosis not present

## 2017-01-24 DIAGNOSIS — D123 Benign neoplasm of transverse colon: Secondary | ICD-10-CM | POA: Diagnosis not present

## 2017-01-24 DIAGNOSIS — Z1211 Encounter for screening for malignant neoplasm of colon: Secondary | ICD-10-CM | POA: Diagnosis not present

## 2017-01-24 DIAGNOSIS — K635 Polyp of colon: Secondary | ICD-10-CM | POA: Diagnosis not present

## 2017-07-01 DIAGNOSIS — R972 Elevated prostate specific antigen [PSA]: Secondary | ICD-10-CM | POA: Diagnosis not present

## 2017-07-01 DIAGNOSIS — E785 Hyperlipidemia, unspecified: Secondary | ICD-10-CM | POA: Diagnosis not present

## 2017-09-03 DIAGNOSIS — R972 Elevated prostate specific antigen [PSA]: Secondary | ICD-10-CM | POA: Diagnosis not present

## 2017-09-03 DIAGNOSIS — D649 Anemia, unspecified: Secondary | ICD-10-CM | POA: Diagnosis not present

## 2017-09-03 DIAGNOSIS — E785 Hyperlipidemia, unspecified: Secondary | ICD-10-CM | POA: Diagnosis not present

## 2017-10-03 DIAGNOSIS — R972 Elevated prostate specific antigen [PSA]: Secondary | ICD-10-CM | POA: Diagnosis not present

## 2017-10-22 DIAGNOSIS — Z8249 Family history of ischemic heart disease and other diseases of the circulatory system: Secondary | ICD-10-CM | POA: Diagnosis not present

## 2017-10-22 DIAGNOSIS — E785 Hyperlipidemia, unspecified: Secondary | ICD-10-CM | POA: Diagnosis not present

## 2017-10-22 DIAGNOSIS — Z7982 Long term (current) use of aspirin: Secondary | ICD-10-CM | POA: Diagnosis not present

## 2017-11-11 DIAGNOSIS — R972 Elevated prostate specific antigen [PSA]: Secondary | ICD-10-CM | POA: Diagnosis not present

## 2017-11-21 DIAGNOSIS — C61 Malignant neoplasm of prostate: Secondary | ICD-10-CM | POA: Diagnosis not present

## 2017-11-22 ENCOUNTER — Encounter: Payer: Self-pay | Admitting: Radiation Oncology

## 2017-11-22 ENCOUNTER — Other Ambulatory Visit: Payer: Self-pay | Admitting: Urology

## 2017-11-22 DIAGNOSIS — C61 Malignant neoplasm of prostate: Secondary | ICD-10-CM

## 2017-12-04 ENCOUNTER — Ambulatory Visit (INDEPENDENT_AMBULATORY_CARE_PROVIDER_SITE_OTHER): Payer: Medicare HMO

## 2017-12-04 ENCOUNTER — Encounter: Payer: Self-pay | Admitting: Podiatry

## 2017-12-04 ENCOUNTER — Ambulatory Visit: Payer: Medicare HMO | Admitting: Podiatry

## 2017-12-04 VITALS — BP 137/79 | HR 60 | Resp 16

## 2017-12-04 DIAGNOSIS — M2042 Other hammer toe(s) (acquired), left foot: Secondary | ICD-10-CM

## 2017-12-04 DIAGNOSIS — M2041 Other hammer toe(s) (acquired), right foot: Secondary | ICD-10-CM | POA: Diagnosis not present

## 2017-12-04 DIAGNOSIS — M2011 Hallux valgus (acquired), right foot: Secondary | ICD-10-CM | POA: Diagnosis not present

## 2017-12-04 DIAGNOSIS — L84 Corns and callosities: Secondary | ICD-10-CM

## 2017-12-04 DIAGNOSIS — Q6652 Congenital pes planus, left foot: Secondary | ICD-10-CM | POA: Diagnosis not present

## 2017-12-04 NOTE — Progress Notes (Signed)
   Subjective:    Patient ID: Joseph Kane, male    DOB: 08-Oct-1950, 67 y.o.   MRN: [id number]  HPI    Review of Systems  Musculoskeletal: Positive for arthralgias, gait problem and myalgias.  All other systems reviewed and are negative.      Objective:   Physical Exam        Assessment & Plan:

## 2017-12-05 NOTE — Progress Notes (Signed)
Subjective:   Patient ID: Joseph Kane, male   DOB: 67 y.o.   MRN: [id number]   HPI Patient presents with severe structural deformity with digital deformity of the second digit right over left with keratotic lesion formation is painful and states that he also gets them on his left lower foot and has collapse of his arch on the left foot.  Patient does not smoke and likes to be active and states the pain is more intense with shoe gear   Review of Systems  All other systems reviewed and are negative.       Objective:  Physical Exam  Constitutional: He appears well-developed and well-nourished.  Cardiovascular: Intact distal pulses.  Pulmonary/Chest: Effort normal.  Musculoskeletal: Normal range of motion.  Neurological: He is alert.  Skin: Skin is warm.  Nursing note and vitals reviewed.   Neurovascular status was found to be diminished but intact bilateral with significant structural deformity of the right over left foot with rotational component to the toes rigid contracture of the second digit right over left and pain with palpation.  Patient also has collapse of medial longitudinal arch left with Charcot rocker-bottom foot structure and keratotic lesion formation     Assessment:  Digital deformity inner second digit right over left with hammertoe deformity structural bunion deformity and rocker-bottom foot structure left foot     Plan:  H&P all conditions reviewed and discussed.  Today debridement was accomplished with padding and I advised this patient on shoe gear modifications and the possibility for surgery in the future.  I did debride 3 separate lesions with no iatrogenic bleeding and applied padding the patient will be seen back as needed  X-rays indicate that there is severe collapse of medial longitudinal arch left and there is structural bunion and hammertoe deformity right over left

## 2017-12-09 ENCOUNTER — Encounter (HOSPITAL_COMMUNITY)
Admission: RE | Admit: 2017-12-09 | Discharge: 2017-12-09 | Disposition: A | Discharge status: Home / Self Care | Payer: Medicare HMO | Source: Ambulatory Visit | Attending: Urology | Admitting: Urology

## 2017-12-09 DIAGNOSIS — C61 Malignant neoplasm of prostate: Secondary | ICD-10-CM

## 2017-12-09 MED ORDER — TECHNETIUM TC 99M MEDRONATE IV KIT
20.0000 | PACK | Freq: Once | INTRAVENOUS | Status: AC | PRN
  Administered 2017-12-09: 21 via INTRAVENOUS

## 2017-12-18 ENCOUNTER — Other Ambulatory Visit: Payer: Self-pay

## 2017-12-18 ENCOUNTER — Encounter: Payer: Self-pay | Admitting: Radiation Oncology

## 2017-12-18 ENCOUNTER — Ambulatory Visit
Admission: RE | Admit: 2017-12-18 | Discharge: 2017-12-18 | Disposition: A | Discharge status: Home / Self Care | Payer: Medicare HMO | Source: Ambulatory Visit | Attending: Radiation Oncology | Admitting: Radiation Oncology

## 2017-12-18 VITALS — BP 131/79 | HR 68 | Temp 99.0°F | Resp 18 | Ht 69.0 in | Wt 164.1 lb

## 2017-12-18 DIAGNOSIS — Z7982 Long term (current) use of aspirin: Secondary | ICD-10-CM | POA: Insufficient documentation

## 2017-12-18 DIAGNOSIS — C61 Malignant neoplasm of prostate: Secondary | ICD-10-CM | POA: Diagnosis not present

## 2017-12-18 DIAGNOSIS — R972 Elevated prostate specific antigen [PSA]: Secondary | ICD-10-CM | POA: Diagnosis not present

## 2017-12-18 DIAGNOSIS — Z87891 Personal history of nicotine dependence: Secondary | ICD-10-CM | POA: Insufficient documentation

## 2017-12-18 DIAGNOSIS — R3989 Other symptoms and signs involving the genitourinary system: Secondary | ICD-10-CM | POA: Diagnosis not present

## 2017-12-18 HISTORY — DX: Malignant neoplasm of prostate: C61

## 2017-12-18 NOTE — Progress Notes (Signed)
See progress note under physician encounter. 

## 2017-12-18 NOTE — Progress Notes (Signed)
Radiation Oncology         [phone]) [phone] ________________________________  Initial outpatient Consultation  Name: Joseph Kane MRN: [id number]  Date: 12/18/2017  DOB: [birth year]  MB:TDHRC, Joseph Rude, MD  McKenzie, Candee Furbish, MD   REFERRING PHYSICIAN: Cleon Gustin, MD  DIAGNOSIS: 67 y.o. gentleman with Stage T1c adenocarcinoma of the prostate with Gleason score of 4+3, and PSA of 4.9.    ICD-10-CM   1. Malignant neoplasm of prostate ([place]) C61 Ambulatory referral to Social Work    HISTORY OF PRESENT ILLNESS: Joseph Kane is a 67 y.o. male with a diagnosis of prostate cancer. He was noted to have an elevated PSA of 4.9 by his primary care physician, Dr. Iona Beard.  Previous PSA in 04/2015 was 3.5.  Accordingly, he was referred for evaluation in urology by Dr. Alyson Ingles on 10/03/2017,  digital rectal examination was performed at that time revealing symmetrical lobes and no nodules.  The patient proceeded to transrectal ultrasound with 12 biopsies of the prostate on 11/11/2017.  The prostate volume measured 33.5 cc.  Out of 12 core biopsies, 3 were positive.  The maximum Gleason score was 4+3, and this was seen in left mid lateral.  Additionally, there was Gleason 3+4 disease in the left apex lateral and Gleason 3+3 disease in the left apex.  He underwent disease staging with CT abdomen pelvis and bone scan on 12/09/2017.  The bone scan showed a single focus of increased uptake at the left sacrum but there was no identified CT correlate.  CT scan did not demonstrate any evidence of lymphadenopathy or metastatic disease.  The patient reviewed the biopsy results with his urologist and he has kindly been referred today for discussion of potential radiation treatment options. He is scheduled to follow up with Dr. Alyson Ingles in the next week.  PREVIOUS RADIATION THERAPY: No  PAST MEDICAL HISTORY:  Past Medical History:  Diagnosis Date  . Asthma    Hx: of as a child "outgrew"  . Prostate cancer  ([place])       PAST SURGICAL HISTORY: Past Surgical History:  Procedure Laterality Date  . HERNIA REPAIR    . INGUINAL HERNIA REPAIR Right 01/14/2013   Procedure: HERNIA REPAIR INGUINAL ADULT;  Surgeon: Imogene Burn. Georgette Dover, MD;  Location: [facility];  Service: General;  Laterality: Right;  . INSERTION OF MESH Right 01/14/2013   Procedure: INSERTION OF MESH;  Surgeon: Imogene Burn. Georgette Dover, MD;  Location: [facility];  Service: General;  Laterality: Right;  . NO PAST SURGERIES      FAMILY HISTORY:  Family History  Problem Relation Age of Onset  . Stroke Mother   . Prostate cancer Father   . Lupus Sister   . Heart disease Sister   . Breast cancer Neg Hx   . Colon cancer Neg Hx   . Pancreatic cancer Neg Hx     SOCIAL HISTORY:  Social History   Socioeconomic History  . Marital status: Married    Spouse name: Not on file  . Number of children: Not on file  . Years of education: Not on file  . Highest education level: Not on file  Occupational History  . Not on file  Social Needs  . Financial resource strain: Not on file  . Food insecurity:    Worry: Not on file    Inability: Not on file  . Transportation needs:    Medical: Not on file    Non-medical: Not on file  Tobacco Use  . Smoking  status: Former Smoker    Packs/day: 2.00    Years: 2.00    Pack years: 4.00    Types: Cigarettes    Last attempt to quit: 09/13/1987    Years since quitting: 30.2  . Smokeless tobacco: Never Used  Substance and Sexual Activity  . Alcohol use: No  . Drug use: No  . Sexual activity: Yes  Lifestyle  . Physical activity:    Days per week: Not on file    Minutes per session: Not on file  . Stress: Not on file  Relationships  . Social connections:    Talks on phone: Not on file    Gets together: Not on file    Attends religious service: Not on file    Active member of club or organization: Not on file    Attends meetings of clubs or organizations: Not on file    Relationship status: Not on file  .  Intimate partner violence:    Fear of current or ex partner: Not on file    Emotionally abused: Not on file    Physically abused: Not on file    Forced sexual activity: Not on file  Other Topics Concern  . Not on file  Social History Narrative  . Not on file    ALLERGIES: Penicillins  MEDICATIONS:  Current Outpatient Medications  Medication Sig Dispense Refill  . aspirin EC 81 MG tablet Take 81 mg by mouth daily.    . fish oil-omega-3 fatty acids 1000 MG capsule Take 1 g by mouth daily.     . Misc Natural Products (PROSTATE THERAPY COMPLEX PO) Take by mouth.    . Multiple Vitamin (MULTIVITAMIN WITH MINERALS) TABS Take 1 tablet by mouth daily.      No current facility-administered medications for this encounter.     REVIEW OF SYSTEMS:  On review of systems, the patient reports that he is doing well overall. He denies any chest pain, shortness of breath, cough, fevers, chills, night sweats. He reports a 10 lb weight loss since diagnosis. He denies any bowel disturbances, and denies abdominal pain, nausea or vomiting. He denies any new musculoskeletal or joint aches or pains. His IPSS was 2, indicating mild urinary symptoms. He reports occasional intermittent stream and occasional straining. He denies dysuria or hematuria and leakage or incontinence. His SHIM was 19, indicating he does not have erectile dysfunction. A complete review of systems is obtained and is otherwise negative.    PHYSICAL EXAM:  Wt Readings from Last 3 Encounters:  12/18/17 164 lb 2 oz (74.4 kg)  04/16/16 170 lb (77.1 kg)  07/10/15 170 lb (77.1 kg)   Temp Readings from Last 3 Encounters:  12/18/17 99 F (37.2 C) (Oral)  04/16/16 98.7 F (37.1 C) (Oral)  07/10/15 98.2 F (36.8 C) (Oral)   BP Readings from Last 3 Encounters:  12/18/17 131/79  12/04/17 137/79  04/16/16 121/83   Pulse Readings from Last 3 Encounters:  12/18/17 68  12/04/17 60  04/16/16 61    /10  In general this is a well appearing  African American in no acute distress. He is alert and oriented x4 and appropriate throughout the examination. HEENT reveals that the patient is normocephalic, atraumatic. EOMs are intact. PERRLA. Skin is intact without any evidence of gross lesions. Cardiovascular exam reveals a regular rate and rhythm, no clicks rubs or murmurs are auscultated. Chest is clear to auscultation bilaterally. Lymphatic assessment is performed and does not reveal any adenopathy in the  cervical, supraclavicular, axillary, or inguinal chains. Abdomen has active bowel sounds in all quadrants and is intact. The abdomen is soft, non tender, non distended. Lower extremities are negative for pretibial pitting edema, deep calf tenderness, cyanosis or clubbing.   KPS = 100  100 - Normal; no complaints; no evidence of disease. 90   - Able to carry on normal activity; minor signs or symptoms of disease. 80   - Normal activity with effort; some signs or symptoms of disease. 69   - Cares for self; unable to carry on normal activity or to do active work. 60   - Requires occasional assistance, but is able to care for most of his personal needs. 50   - Requires considerable assistance and frequent medical care. 73   - Disabled; requires special care and assistance. 35   - Severely disabled; hospital admission is indicated although death not imminent. 46   - Very sick; hospital admission necessary; active supportive treatment necessary. 10   - Moribund; fatal processes progressing rapidly. 0     - Dead  Karnofsky DA, Abelmann [place], Craver LS and Burchenal [facility] [facility] [phone]) The use of the nitrogen mustards in the palliative treatment of carcinoma: with particular reference to bronchogenic carcinoma Cancer 1 [id number]  LABORATORY DATA:  Lab Results  Component Value Date   WBC 5.1 01/12/2013   HGB 13.0 01/12/2013   HCT 39.1 01/12/2013   MCV 87.5 01/12/2013   PLT 300 01/12/2013   No results found for: NA, K, CL, CO2 No results found for:  ALT, AST, GGT, ALKPHOS, BILITOT   RADIOGRAPHY: Nm Bone Scan Whole Body  Result Date: 12/09/2017 CLINICAL DATA:  Recent diagnosis prostate cancer, PSA 4.9, no bone pain EXAM: NUCLEAR MEDICINE WHOLE BODY BONE SCAN TECHNIQUE: Whole body anterior and posterior images were obtained approximately 3 hours after intravenous injection of radiopharmaceutical. RADIOPHARMACEUTICALS:  21 mCi Technetium-45mMDP IV COMPARISON:  None Radiographic correlation: CT abdomen and pelvis 12/09/2017 FINDINGS: Gas uptake at RIGHT shoulder, LEFT elbow, BILATERAL wrists, RIGHT knee, and BILATERAL feet, typically degenerative. Patient hands/wrists are superimposed with the pelvis/hips bilaterally on whole-body images, reimaged without superposition of the hands on static views. Single focus of questionable abnormal increased tracer accumulation at the LEFT sacrum which persists on spot image, without identified focal osseous abnormality on CT, of uncertain significance. No additional sites of worrisome osseous tracer accumulation are identified. Scattered costal cartilaginous uptake of tracer. Expected urinary tract and soft tissue distribution of tracer. IMPRESSION: Scattered degenerative type uptake with a single focus of nonspecific increased tracer localization at the anterior LEFT sacrum of uncertain etiology, without accompanying correlate of finding by CT. Osseous metastatic lesion is not excluded. Consider sacral assessment by MR imaging to exclude an osseous metastasis. Electronically Signed   By: MLavonia DanaM.D.   On: 12/09/2017 16:49   Dg Foot 2 Views Left  Result Date: 12/04/2017 Please see detailed radiograph report in office note.  Dg Foot 2 Views Right  Result Date: 12/04/2017 Please see detailed radiograph report in office note.     IMPRESSION/PLAN: 1. 67y.o. gentleman with Stage T1c adenocarcinoma of the prostate with Gleason Score of 4+3, and PSA of 4.9. We discussed the patient's workup and outlines the  nature of prostate cancer in this setting. The patient's T stage, Gleason's score, and PSA put him into the unfavorable intermediate risk group. Accordingly, he is eligible for a variety of potential treatment options including brachytherapy, 5.5-8 weeks of external radiation or 5 weeks  of external radiation followed by a brachytherapy boost. We discussed the available radiation techniques, and focused on the details and logistics and delivery. We discussed and outlined the risks, benefits, short and long-term effects associated with radiotherapy and compared and contrasted these with prostatectomy. We also discussed the role of SpaceOAR in reducing the rectal toxicity associated with radiotherapy.   At the conclusion of our conversation, the patient elects to move forward with brachytherapy and use of SpaceOAR to reduce rectal toxicity from radiotherapy.  We will share our discussion with Dr. Alyson Ingles and move forward with scheduling his CT [facility] planning appointment in the near future.  The patient met briefly with Romie Jumper in our office who will be working closely with him to coordinate OR scheduling and pre and post procedure appointments.  We will contact the pharmaceutical rep to ensure that [place] is available at the time of procedure.  He will have a prostate MRI following his post-seed CT SIM to confirm appropriate distribution of the [place].  We spent 60 minutes face to face with the patient and more than 50% of that time was spent in counseling and/or coordination of care.    Nicholos Johns, PA-C    Tyler Pita, MD  [facility] Direct Dial: [phone]  Fax: [phone] [email or web address]  Skype  [email or web address]   This document serves as a record of services personally performed by Tyler Pita, MD and Freeman Caldron, PA-C. It was created on their behalf by Wilburn Mylar, a trained medical scribe. The creation of this record is based on the scribe's  personal observations and the provider's statements to them. This document has been checked and approved by the attending provider.

## 2017-12-18 NOTE — Progress Notes (Signed)
GU Location of Tumor / Histology: prostatic adenocarcinoma  If Prostate Cancer, Gleason Score is (4 + 3) and PSA is (4.9) on 07/01/2017. Prostate volume: 33.5 grams.   Randel Pigg was referred by Dr. Iona Beard to Dr. Alyson Ingles in August 2019 for further evaluation of an elevated PSA. March 2017 PSA was 3.5.   Biopsies of prostate (if applicable) revealed:    Past/Anticipated interventions by urology, if any: prostate biopsy, bone scan (degenerative disease), referral to Dr. Tammi Klippel for consideration of IMRT or brachytherapy  Past/Anticipated interventions by medical oncology, if any: no  Weight changes, if any: Down 10 lb since diagnosis. Normal weight 175 lb. Today's weight 164 lb.  Bowel/Bladder complaints, if any: Reports occasional intermittent stream. Reports occasionally he has to strain to void.  Denies dysuria or hematuria. Denies urinary leakage or incontinence.  Nausea/Vomiting, if any: no  Pain issues, if any:  no  SAFETY ISSUES:  Prior radiation? no  Pacemaker/ICD? no  Possible current pregnancy? no  Is the patient on methotrexate? no  Current Complaints / other details:  67 year old male. Married. Works as a Interior and spatial designer. Reports his father passed of prostate cancer.

## 2017-12-19 ENCOUNTER — Encounter: Payer: Self-pay | Admitting: General Practice

## 2017-12-19 DIAGNOSIS — C61 Malignant neoplasm of prostate: Secondary | ICD-10-CM | POA: Insufficient documentation

## 2017-12-19 NOTE — Progress Notes (Signed)
[place] Psychosocial Distress Screening Clinical Social Work  Clinical Social Work was referred by distress screening protocol.  The patient scored a 7 on the Psychosocial Distress Thermometer which indicates moderate distress. Clinical Social Worker contacted patient by phone to assess for distress and other psychosocial needs. Patient reports that he feels much reassured after speaking w treatment team and learning about his prognosis and plan.  Has no physical symptoms so diagnosis came as a surprise, and was quite worrisome.  After meeting w team, his anxiety has decreased significantly.  Reviewed support options available, will mail information packet to his home for review.  Encouraged to contact us as needed.    ONCBCN DISTRESS SCREENING 12/18/2017  Screening Type Initial Screening  Distress experienced in past week (1-10) 7  Practical problem type Work/school  Family Problem type Children  Emotional problem type Nervousness/Anxiety;Adjusting to illness  Information Concerns Type Lack of info about diagnosis;Lack of info about treatment;Lack of info about complementary therapy choices;Lack of info about maintaining fitness  Physical Problem type Sexual problems  Physician notified of physical symptoms Yes  Referral to clinical psychology No  Referral to clinical social work No  Referral to dietition No  Referral to financial advocate No  Referral to support programs No  Referral to palliative care No    Clinical Social Worker follow up needed: No.  If yes, follow up plan:  Beverely Pace, [place], LCSW Clinical Social Worker Phone:  [phone]

## 2017-12-20 ENCOUNTER — Telehealth: Payer: Self-pay | Admitting: *Deleted

## 2017-12-20 NOTE — Telephone Encounter (Signed)
CALLED PATIENT TO INFORM OF PRE-SEED CT FOR 01-17-18 - ARRIVAL TIME- 10 AM, SPOKE WITH PATIENT AND HE IS AWARE OF THIS APPT.

## 2017-12-23 ENCOUNTER — Telehealth: Payer: Self-pay | Admitting: *Deleted

## 2017-12-23 NOTE — Telephone Encounter (Signed)
CALLED PATIENT TO INFORM OF IMPLANT DATE ON 02-24-18 @ 1 PM, SPOKE WITH PATIENT AND HE IS AWARE OF THIS DATE

## 2017-12-24 DIAGNOSIS — C61 Malignant neoplasm of prostate: Secondary | ICD-10-CM | POA: Diagnosis not present

## 2017-12-25 ENCOUNTER — Other Ambulatory Visit: Payer: Self-pay | Admitting: Urology

## 2018-01-16 ENCOUNTER — Telehealth: Payer: Self-pay | Admitting: *Deleted

## 2018-01-16 NOTE — Telephone Encounter (Signed)
Called patient to remind of pre-seed planning, chest x-ray and EKG for 01-17-18, lvm for a return call

## 2018-01-17 ENCOUNTER — Ambulatory Visit
Admission: RE | Admit: 2018-01-17 | Discharge: 2018-01-17 | Disposition: A | Discharge status: Home / Self Care | Payer: Medicare HMO | Source: Ambulatory Visit | Attending: Radiation Oncology | Admitting: Radiation Oncology

## 2018-01-17 ENCOUNTER — Other Ambulatory Visit: Payer: Self-pay | Admitting: Urology

## 2018-01-17 ENCOUNTER — Encounter: Payer: Self-pay | Admitting: Medical Oncology

## 2018-01-17 ENCOUNTER — Ambulatory Visit (HOSPITAL_COMMUNITY)
Admission: RE | Admit: 2018-01-17 | Discharge: 2018-01-17 | Disposition: A | Discharge status: Home / Self Care | Payer: Medicare HMO | Source: Ambulatory Visit | Attending: Urology | Admitting: Urology

## 2018-01-17 ENCOUNTER — Encounter (HOSPITAL_COMMUNITY)
Admission: RE | Admit: 2018-01-17 | Discharge: 2018-01-17 | Disposition: A | Discharge status: Home / Self Care | Payer: Medicare HMO | Source: Ambulatory Visit | Attending: Urology | Admitting: Urology

## 2018-01-17 DIAGNOSIS — Z01810 Encounter for preprocedural cardiovascular examination: Secondary | ICD-10-CM | POA: Insufficient documentation

## 2018-01-17 DIAGNOSIS — Z01818 Encounter for other preprocedural examination: Secondary | ICD-10-CM | POA: Insufficient documentation

## 2018-01-17 DIAGNOSIS — C61 Malignant neoplasm of prostate: Secondary | ICD-10-CM

## 2018-01-17 NOTE — Progress Notes (Signed)
  Radiation Oncology         [phone]) [phone] ________________________________  Name: Joseph Kane MRN: [id number]  Date: 01/17/2018  DOB: 12/29/1950  SIMULATION AND TREATMENT PLANNING NOTE PUBIC ARCH STUDY  OH:KGOVP, Myra Rude, MD  Cleon Gustin, MD  DIAGNOSIS:  67 y.o. gentleman with Stage T1c adenocarcinoma of the prostate with Gleason score of 4+3, and PSA of 4.9.     ICD-10-CM   1. Malignant neoplasm of prostate ([place]) C61     COMPLEX SIMULATION:  The patient presented today for evaluation for possible prostate seed implant. He was brought to the radiation planning suite and placed supine on the CT couch. A 3-dimensional image study set was obtained in upload to the planning computer. There, on each axial slice, I contoured the prostate gland. Then, using three-dimensional radiation planning tools I reconstructed the prostate in view of the structures from the transperineal needle pathway to assess for possible pubic arch interference. In doing so, I did not appreciate any pubic arch interference. Also, the patient's prostate volume was estimated based on the drawn structure. The volume was 33 cc.  Given the pubic arch appearance and prostate volume, patient remains a good candidate to proceed with prostate seed implant. Today, he freely provided informed written consent to proceed.    PLAN: The patient will undergo prostate seed implant.   ________________________________  Sheral Apley. Tammi Klippel, M.D.

## 2018-01-17 NOTE — Progress Notes (Signed)
Introduced myself to Mr. Joseph Kane and his wife as the prostate nurse navigator and my role. I was unable to meet him the day he consulted with Dr. Tammi Klippel. After arriving in [place], he stated he might want to do external beam instead of brachytherapy. He had many questions about the different treatments and outcomes. Ashlyn- PA discussed with patient the treatments and offered to rescheduled him so he could go home and consider. After discussion, he would like to proceed with brachytherapy.

## 2018-02-06 ENCOUNTER — Encounter (HOSPITAL_BASED_OUTPATIENT_CLINIC_OR_DEPARTMENT_OTHER): Payer: Self-pay

## 2018-02-07 ENCOUNTER — Other Ambulatory Visit: Payer: Self-pay

## 2018-02-07 ENCOUNTER — Encounter (HOSPITAL_BASED_OUTPATIENT_CLINIC_OR_DEPARTMENT_OTHER): Payer: Self-pay

## 2018-02-07 NOTE — Progress Notes (Signed)
Spoke with:  [facility] NPO:  No food after midnight/Clear liquids until 7:00AM DOS Arrival time: 1100AM AM medications: Fleet enema Pre op orders: Yes Ride home: Grayce Sessions (wife) [phone]

## 2018-02-14 ENCOUNTER — Telehealth: Payer: Self-pay | Admitting: *Deleted

## 2018-02-14 NOTE — Telephone Encounter (Signed)
CALLED PATIENT TO REMIND OF LAB WORK FOR 02-17-18 - ARRIVAL TIME - 1:45 PM @ [facility] ADMITTING, SPOKE WITH PATIENT AND HE IS AWARE OF THIS APPT.

## 2018-02-17 ENCOUNTER — Encounter (HOSPITAL_COMMUNITY)
Admission: RE | Admit: 2018-02-17 | Discharge: 2018-02-17 | Disposition: A | Discharge status: Home / Self Care | Payer: Medicare HMO | Source: Ambulatory Visit | Attending: Urology | Admitting: Urology

## 2018-02-17 DIAGNOSIS — Z01812 Encounter for preprocedural laboratory examination: Secondary | ICD-10-CM | POA: Diagnosis present

## 2018-02-17 LAB — CBC
HCT: 39.2 % (ref 39.0–52.0)
HEMOGLOBIN: 12.3 g/dL — AB (ref 13.0–17.0)
MCH: 28.7 pg (ref 26.0–34.0)
MCHC: 31.4 g/dL (ref 30.0–36.0)
MCV: 91.6 fL (ref 80.0–100.0)
Platelets: 252 10*3/uL (ref 150–400)
RBC: 4.28 MIL/uL (ref 4.22–5.81)
RDW: 14.1 % (ref 11.5–15.5)
WBC: 4.1 10*3/uL (ref 4.0–10.5)
nRBC: 0 % (ref 0.0–0.2)

## 2018-02-17 LAB — COMPREHENSIVE METABOLIC PANEL
ALT: 35 U/L (ref 0–44)
AST: 33 U/L (ref 15–41)
Albumin: 4 g/dL (ref 3.5–5.0)
Alkaline Phosphatase: 79 U/L (ref 38–126)
Anion gap: 7 (ref 5–15)
BILIRUBIN TOTAL: 0.7 mg/dL (ref 0.3–1.2)
BUN: 20 mg/dL (ref 8–23)
CO2: 27 mmol/L (ref 22–32)
Calcium: 9 mg/dL (ref 8.9–10.3)
Chloride: 107 mmol/L (ref 98–111)
Creatinine, Ser: 1.11 mg/dL (ref 0.61–1.24)
GFR calc Af Amer: >60 mL/min (ref >60)
Glucose, Bld: 78 mg/dL (ref 70–99)
Potassium: 3.9 mmol/L (ref 3.5–5.1)
Sodium: 141 mmol/L (ref 135–145)
Total Protein: 7.7 g/dL (ref 6.5–8.1)

## 2018-02-17 LAB — PROTIME-INR
INR: 0.9
Prothrombin Time: 12 seconds (ref 11.4–15.2)

## 2018-02-17 LAB — APTT: aPTT: 27 seconds (ref 24–36)

## 2018-02-21 ENCOUNTER — Telehealth: Payer: Self-pay | Admitting: *Deleted

## 2018-02-21 NOTE — Telephone Encounter (Signed)
CALLED PATIENT TO REMIND OF PROCEDURE FOR 02-24-18 , SPOKE WITH PATIENT'S [name] AND SHE IS AWARE OF THIS PROCEDURE

## 2018-02-24 ENCOUNTER — Ambulatory Visit (HOSPITAL_BASED_OUTPATIENT_CLINIC_OR_DEPARTMENT_OTHER)
Admission: RE | Admit: 2018-02-24 | Discharge: 2018-02-24 | Disposition: A | Discharge status: Home / Self Care | Payer: Medicare HMO | Source: Ambulatory Visit | Attending: Urology | Admitting: Urology

## 2018-02-24 ENCOUNTER — Ambulatory Visit (HOSPITAL_COMMUNITY): Payer: Medicare HMO

## 2018-02-24 ENCOUNTER — Encounter (HOSPITAL_BASED_OUTPATIENT_CLINIC_OR_DEPARTMENT_OTHER)
Admission: RE | Disposition: A | Discharge status: Home / Self Care | Payer: Self-pay | Source: Ambulatory Visit | Attending: Urology

## 2018-02-24 ENCOUNTER — Ambulatory Visit (HOSPITAL_BASED_OUTPATIENT_CLINIC_OR_DEPARTMENT_OTHER): Payer: Medicare HMO | Admitting: Anesthesiology

## 2018-02-24 ENCOUNTER — Encounter (HOSPITAL_BASED_OUTPATIENT_CLINIC_OR_DEPARTMENT_OTHER): Payer: Self-pay | Admitting: *Deleted

## 2018-02-24 ENCOUNTER — Ambulatory Visit (HOSPITAL_BASED_OUTPATIENT_CLINIC_OR_DEPARTMENT_OTHER): Payer: Medicare HMO | Admitting: Physician Assistant

## 2018-02-24 DIAGNOSIS — Z87891 Personal history of nicotine dependence: Secondary | ICD-10-CM | POA: Insufficient documentation

## 2018-02-24 DIAGNOSIS — C61 Malignant neoplasm of prostate: Secondary | ICD-10-CM | POA: Diagnosis not present

## 2018-02-24 DIAGNOSIS — Z01818 Encounter for other preprocedural examination: Secondary | ICD-10-CM

## 2018-02-24 HISTORY — PX: RADIOACTIVE SEED IMPLANT: SHX5150

## 2018-02-24 HISTORY — DX: Personal history of other diseases of the digestive system: Z87.19

## 2018-02-24 HISTORY — DX: Other cervical disc degeneration, unspecified cervical region: M50.30

## 2018-02-24 HISTORY — PX: SPACE OAR INSTILLATION: SHX6769

## 2018-02-24 HISTORY — PX: CYSTOSCOPY: SHX5120

## 2018-02-24 HISTORY — DX: Personal history of colonic polyps: Z86.010

## 2018-02-24 HISTORY — DX: Anemia, unspecified: D64.9

## 2018-02-24 HISTORY — DX: Spondylosis without myelopathy or radiculopathy, cervical region: M47.812

## 2018-02-24 SURGERY — INSERTION, RADIATION SOURCE, PROSTATE
Anesthesia: General | Site: Rectum

## 2018-02-24 MED ORDER — ONDANSETRON HCL 4 MG/2ML IJ SOLN
4.0000 mg | Freq: Once | INTRAMUSCULAR | Status: DC | PRN
  Filled 2018-02-24: qty 2

## 2018-02-24 MED ORDER — CEFAZOLIN SODIUM-DEXTROSE 2-4 GM/100ML-% IV SOLN
INTRAVENOUS | Status: AC
  Filled 2018-02-24: qty 100

## 2018-02-24 MED ORDER — ONDANSETRON HCL 4 MG/2ML IJ SOLN
INTRAMUSCULAR | Status: AC
  Filled 2018-02-24: qty 2

## 2018-02-24 MED ORDER — IOHEXOL 300 MG/ML  SOLN
INTRAMUSCULAR | Status: DC | PRN
  Administered 2018-02-24: 7 mL

## 2018-02-24 MED ORDER — FENTANYL CITRATE (PF) 100 MCG/2ML IJ SOLN
INTRAMUSCULAR | Status: DC | PRN
  Administered 2018-02-24 (×2): 25 ug via INTRAVENOUS
  Administered 2018-02-24: 50 ug via INTRAVENOUS

## 2018-02-24 MED ORDER — ACETAMINOPHEN 160 MG/5ML PO SOLN
325.0000 mg | ORAL | Status: DC | PRN
  Filled 2018-02-24: qty 20.3

## 2018-02-24 MED ORDER — SODIUM CHLORIDE (PF) 0.9 % IJ SOLN
INTRAMUSCULAR | Status: DC | PRN
  Administered 2018-02-24: 10 mL

## 2018-02-24 MED ORDER — TRAMADOL HCL 50 MG PO TABS
ORAL_TABLET | ORAL | Status: AC
  Filled 2018-02-24: qty 1

## 2018-02-24 MED ORDER — LACTATED RINGERS IV SOLN
INTRAVENOUS | Status: DC
  Administered 2018-02-24 (×2): via INTRAVENOUS
  Filled 2018-02-24: qty 1000

## 2018-02-24 MED ORDER — MEPERIDINE HCL 25 MG/ML IJ SOLN
6.2500 mg | INTRAMUSCULAR | Status: DC | PRN
  Filled 2018-02-24: qty 1

## 2018-02-24 MED ORDER — DEXAMETHASONE SODIUM PHOSPHATE 10 MG/ML IJ SOLN
INTRAMUSCULAR | Status: DC | PRN
  Administered 2018-02-24: 10 mg via INTRAVENOUS

## 2018-02-24 MED ORDER — TRAMADOL HCL 50 MG PO TABS: 50.0000 mg | ORAL_TABLET | Freq: Four times a day (QID) | ORAL | 0 refills | Status: AC | PRN

## 2018-02-24 MED ORDER — FLEET ENEMA 7-19 GM/118ML RE ENEM
1.0000 | ENEMA | Freq: Once | RECTAL | Status: DC
  Filled 2018-02-24: qty 1

## 2018-02-24 MED ORDER — LIDOCAINE 2% (20 MG/ML) 5 ML SYRINGE
INTRAMUSCULAR | Status: DC | PRN
  Administered 2018-02-24: 80 mg via INTRAVENOUS

## 2018-02-24 MED ORDER — FENTANYL CITRATE (PF) 100 MCG/2ML IJ SOLN
25.0000 ug | INTRAMUSCULAR | Status: DC | PRN
  Filled 2018-02-24: qty 1

## 2018-02-24 MED ORDER — TRAMADOL HCL 50 MG PO TABS
50.0000 mg | ORAL_TABLET | Freq: Once | ORAL | Status: AC
  Administered 2018-02-24: 50 mg via ORAL
  Filled 2018-02-24: qty 1

## 2018-02-24 MED ORDER — PROPOFOL 10 MG/ML IV BOLUS
INTRAVENOUS | Status: AC
  Filled 2018-02-24: qty 20

## 2018-02-24 MED ORDER — OXYCODONE HCL 5 MG/5ML PO SOLN
5.0000 mg | Freq: Once | ORAL | Status: DC | PRN
  Filled 2018-02-24: qty 5

## 2018-02-24 MED ORDER — OXYCODONE HCL 5 MG PO TABS
5.0000 mg | ORAL_TABLET | Freq: Once | ORAL | Status: DC | PRN
  Filled 2018-02-24: qty 1

## 2018-02-24 MED ORDER — LIDOCAINE 2% (20 MG/ML) 5 ML SYRINGE
INTRAMUSCULAR | Status: AC
  Filled 2018-02-24: qty 5

## 2018-02-24 MED ORDER — MIDAZOLAM HCL 5 MG/5ML IJ SOLN
INTRAMUSCULAR | Status: DC | PRN
  Administered 2018-02-24: 2 mg via INTRAVENOUS

## 2018-02-24 MED ORDER — CEFAZOLIN SODIUM-DEXTROSE 2-4 GM/100ML-% IV SOLN
2.0000 g | Freq: Once | INTRAVENOUS | Status: AC
  Administered 2018-02-24: 2 g via INTRAVENOUS
  Filled 2018-02-24: qty 100

## 2018-02-24 MED ORDER — SODIUM CHLORIDE 0.9 % IR SOLN
Status: DC | PRN
  Administered 2018-02-24: 1000 mL via INTRAVESICAL

## 2018-02-24 MED ORDER — ACETAMINOPHEN 325 MG PO TABS
325.0000 mg | ORAL_TABLET | ORAL | Status: DC | PRN
  Filled 2018-02-24: qty 2

## 2018-02-24 MED ORDER — MIDAZOLAM HCL 2 MG/2ML IJ SOLN
INTRAMUSCULAR | Status: AC
  Filled 2018-02-24: qty 2

## 2018-02-24 MED ORDER — PROPOFOL 10 MG/ML IV BOLUS
INTRAVENOUS | Status: DC | PRN
  Administered 2018-02-24: 150 mg via INTRAVENOUS

## 2018-02-24 MED ORDER — ONDANSETRON HCL 4 MG/2ML IJ SOLN
INTRAMUSCULAR | Status: DC | PRN
  Administered 2018-02-24: 4 mg via INTRAVENOUS

## 2018-02-24 MED ORDER — DEXAMETHASONE SODIUM PHOSPHATE 10 MG/ML IJ SOLN
INTRAMUSCULAR | Status: AC
  Filled 2018-02-24: qty 1

## 2018-02-24 MED ORDER — FENTANYL CITRATE (PF) 250 MCG/5ML IJ SOLN
INTRAMUSCULAR | Status: AC
  Filled 2018-02-24: qty 5

## 2018-02-24 SURGICAL SUPPLY — 44 items
BAG URINE DRAINAGE (UROLOGICAL SUPPLIES) ×4 IMPLANT
BLADE CLIPPER SURG (BLADE) ×4 IMPLANT
CATH FOLEY 2WAY SLVR  5CC 16FR (CATHETERS) ×2
CATH FOLEY 2WAY SLVR 5CC 16FR (CATHETERS) ×6 IMPLANT
CATH ROBINSON RED A/P 20FR (CATHETERS) ×4 IMPLANT
CLOTH BEACON ORANGE TIMEOUT ST (SAFETY) ×4 IMPLANT
CONT SPECI 4OZ STER CLIK (MISCELLANEOUS) ×8 IMPLANT
COVER BACK TABLE 60X90IN (DRAPES) ×4 IMPLANT
COVER MAYO STAND STRL (DRAPES) ×4 IMPLANT
COVER WAND RF STERILE (DRAPES) IMPLANT
DRSG TEGADERM 4X4.75 (GAUZE/BANDAGES/DRESSINGS) ×4 IMPLANT
DRSG TEGADERM 8X12 (GAUZE/BANDAGES/DRESSINGS) ×4 IMPLANT
GAUZE SPONGE 4X4 12PLY STRL (GAUZE/BANDAGES/DRESSINGS) ×4 IMPLANT
GLOVE BIO SURGEON STRL SZ 6 (GLOVE) IMPLANT
GLOVE BIO SURGEON STRL SZ 6.5 (GLOVE) IMPLANT
GLOVE BIO SURGEON STRL SZ7 (GLOVE) IMPLANT
GLOVE BIO SURGEON STRL SZ8 (GLOVE) ×4 IMPLANT
GLOVE BIOGEL PI IND STRL 6 (GLOVE) IMPLANT
GLOVE BIOGEL PI IND STRL 6.5 (GLOVE) IMPLANT
GLOVE BIOGEL PI IND STRL 7.0 (GLOVE) ×3 IMPLANT
GLOVE BIOGEL PI IND STRL 8 (GLOVE) ×3 IMPLANT
GLOVE BIOGEL PI INDICATOR 6 (GLOVE)
GLOVE BIOGEL PI INDICATOR 6.5 (GLOVE)
GLOVE BIOGEL PI INDICATOR 7.0 (GLOVE) ×1
GLOVE BIOGEL PI INDICATOR 8 (GLOVE) ×1
GLOVE ECLIPSE 8.0 STRL XLNG CF (GLOVE) ×8 IMPLANT
GOWN STRL REUS W/TWL XL LVL3 (GOWN DISPOSABLE) ×4 IMPLANT
HOLDER FOLEY CATH W/STRAP (MISCELLANEOUS) ×4 IMPLANT
I-SEED AGX100 ×336 IMPLANT
IMPL SPACEOAR SYSTEM 10ML (Spacer) ×3 IMPLANT
IMPLANT SPACEOAR SYSTEM 10ML (Spacer) ×4 IMPLANT
IV NS 1000ML (IV SOLUTION) ×1
IV NS 1000ML BAXH (IV SOLUTION) ×3 IMPLANT
KIT TURNOVER CYSTO (KITS) ×4 IMPLANT
MANIFOLD NEPTUNE II (INSTRUMENTS) IMPLANT
MARKER SKIN DUAL TIP RULER LAB (MISCELLANEOUS) ×4 IMPLANT
PACK CYSTO (CUSTOM PROCEDURE TRAY) ×4 IMPLANT
SURGILUBE 2OZ TUBE FLIPTOP (MISCELLANEOUS) ×4 IMPLANT
SUT BONE WAX W31G (SUTURE) IMPLANT
SYR 10ML LL (SYRINGE) ×8 IMPLANT
TOWEL OR 17X24 6PK STRL BLUE (TOWEL DISPOSABLE) ×8 IMPLANT
UNDERPAD 30X30 (UNDERPADS AND DIAPERS) ×8 IMPLANT
WATER STERILE IRR 3000ML UROMA (IV SOLUTION) IMPLANT
WATER STERILE IRR 500ML POUR (IV SOLUTION) ×4 IMPLANT

## 2018-02-24 NOTE — Anesthesia Procedure Notes (Signed)
Procedure Name: LMA Insertion Date/Time: 02/24/2018 1:17 PM Performed by: Gwyndolyn Saxon, CRNA Pre-anesthesia Checklist: Patient identified, Suction available, Emergency Drugs available and Patient being monitored Patient Re-evaluated:Patient Re-evaluated prior to induction Oxygen Delivery Method: Circle system utilized Preoxygenation: Pre-oxygenation with 100% oxygen Induction Type: IV induction Ventilation: Mask ventilation without difficulty LMA: LMA inserted LMA Size: 4.0 Number of attempts: 1 Placement Confirmation: positive ETCO2 and breath sounds checked- equal and bilateral Tube secured with: Tape Dental Injury: Teeth and Oropharynx as per pre-operative assessment

## 2018-02-24 NOTE — Anesthesia Postprocedure Evaluation (Signed)
Anesthesia Post Note  Patient: Joseph Kane  Procedure(s) Performed: RADIOACTIVE SEED IMPLANT/BRACHYTHERAPY IMPLANT (N/A Prostate) SPACE OAR INSTILLATION (N/A Rectum) CYSTOSCOPY (N/A Bladder)     Patient location during evaluation: PACU Anesthesia Type: General Level of consciousness: awake Pain management: pain level controlled Vital Signs Assessment: post-procedure vital signs reviewed and stable Respiratory status: spontaneous breathing Cardiovascular status: stable Postop Assessment: no apparent nausea or vomiting    Last Vitals:  Vitals:   02/24/18 1530 02/24/18 1634  BP: 139/81 126/75  Pulse: 61 62  Resp: 13 14  Temp: (!) 36.3 C 36.6 C  SpO2: 100% 100%    Last Pain:  Vitals:   02/24/18 1615  TempSrc:   PainSc: 4                  Man Effertz

## 2018-02-24 NOTE — Discharge Instructions (Signed)
Post Anesthesia Home Care Instructions  Activity: Get plenty of rest for the remainder of the day. A responsible individual must stay with you for 24 hours following the procedure.  For the next 24 hours, DO NOT: -Drive a car -Paediatric nurse -Drink alcoholic beverages -Take any medication unless instructed by your physician -Make any legal decisions or sign important papers.  Meals: Start with liquid foods such as gelatin or soup. Progress to regular foods as tolerated. Avoid greasy, spicy, heavy foods. If nausea and/or vomiting occur, drink only clear liquids until the nausea and/or vomiting subsides. Call your physician if vomiting continues.  Special Instructions/Symptoms: Your throat may feel dry or sore from the anesthesia or the breathing tube placed in your throat during surgery. If this causes discomfort, gargle with warm salt water. The discomfort should disappear within 24 hours.  If you had a scopolamine patch placed behind your ear for the management of post- operative nausea and/or vomiting:  1. The medication in the patch is effective for 72 hours, after which it should be removed.  Wrap patch in a tissue and discard in the trash. Wash hands thoroughly with soap and water. 2. You may remove the patch earlier than 72 hours if you experience unpleasant side effects which may include dry mouth, dizziness or visual disturbances. 3. Avoid touching the patch. Wash your hands with soap and water after contact with the patch.     .Indwelling Urinary Catheter Care, Adult An indwelling urinary catheter is a thin tube that is put into your bladder. The tube helps to drain pee (urine) out of your body. The tube goes in through your urethra. Your urethra is where pee comes out of your body. Your pee will come out through the catheter, then it will go into a bag (drainage bag). Take good care of your catheter so it will work well. How to wear your catheter and bag Supplies  needed  Sticky tape (adhesive tape) or a leg strap.  Alcohol wipe or soap and water (if you use tape).  A clean towel (if you use tape).  Large overnight bag.  Smaller bag (leg bag). Wearing your catheter Attach your catheter to your leg with tape or a leg strap.  Make sure the catheter is not pulled tight.  If a leg strap gets wet, take it off and put on a dry strap.  If you use tape to hold the bag on your leg: 1. Use an alcohol wipe or soap and water to wash your skin where the tape made it sticky before. 2. Use a clean towel to pat-dry that skin. 3. Use new tape to make the bag stay on your leg. Wearing your bags You should have been given a large overnight bag.  You may wear the overnight bag in the day or night.  Always have the overnight bag lower than your bladder.  Do not let the bag touch the floor.  Before you go to sleep, put a clean plastic bag in a wastebasket. Then hang the overnight bag inside the wastebasket. You should also have a smaller leg bag that fits under your clothes.  Always wear the leg bag below your knee.  Do not wear your leg bag at night. How to care for your skin and catheter Supplies needed  A clean washcloth.  Water and mild soap.  A clean towel. Caring for your skin and catheter      Clean the skin around your catheter every day: ?  Wash your hands with soap and water. ? Wet a clean washcloth in warm water and mild soap. ? Clean the skin around your urethra. ? If you are male: ? Gently spread the folds of skin around your vagina (labia). ? With the washcloth in your other hand, wipe the inner side of your labia on each side. Wipe from front to back. ? If you are male: ? Pull back any skin that covers the end of your penis (foreskin). ? With the washcloth in your other hand, wipe your penis in small circles. Start wiping at the tip of your penis, then move away from the catheter. ? With your free hand, hold the catheter  close to where it goes into your body. ? Keep holding the catheter during cleaning so it does not get pulled out. ? With the washcloth in your other hand, clean the catheter. ? Only wipe downward on the catheter. ? Do not wipe upward toward your body. Doing this may push germs into your urethra and cause infection. ? Use a clean towel to pat-dry the catheter and the skin around it. Make sure to wipe off all soap. ? Wash your hands with soap and water.  Shower every day. Do not take baths.  Do not use cream, ointment, or lotion on the area where the catheter goes into your body, unless your doctor tells you to.  Do not use powders, sprays, or lotions on your genital area.  Check your skin around the catheter every day for signs of infection. Check for: ? Redness, swelling, or pain. ? Fluid or blood. ? Warmth. ? Pus or a bad smell. How to empty the bag Supplies needed  Rubbing alcohol.  Gauze pad or cotton ball.  Tape or a leg strap. Emptying the bag Pour the pee out of your bag when it is ?- full, or at least 2-3 times a day. Do this for your overnight bag and your leg bag. 1. Wash your hands with soap and water. 2. Separate (detach) the bag from your leg. 3. Hold the bag over the toilet or a clean pail. Keep the bag lower than your hips and bladder. This is so the pee (urine) does not go back into the tube. 4. Open the pour spout. It is at the bottom of the bag. 5. Empty the pee into the toilet or pail. Do not let the pour spout touch any surface. 6. Put rubbing alcohol on a gauze pad or cotton ball. 7. Use the gauze pad or cotton ball to clean the pour spout. 8. Close the pour spout. 9. Attach the bag to your leg with tape or a leg strap. 10. Wash your hands with soap and water. Follow instructions for cleaning the drainage bag:  From the product maker.  As told by your doctor. How to change the bag Supplies needed  Alcohol wipes.  A clean bag.  Tape or a leg  strap. Changing the bag Replace your bag with a clean bag once a month. If it starts to leak, smell bad, or look dirty, change it sooner. 1. Wash your hands with soap and water. 2. Separate the dirty bag from your leg. 3. Pinch the catheter with your fingers so that pee does not spill out. 4. Separate the catheter tube from the bag tube where these tubes connect (at the connection valve). Do not let the tubes touch any surface. 5. Clean the end of the catheter tube with an alcohol wipe. Use  a different alcohol wipe to clean the end of the bag tube. 6. Connect the catheter tube to the tube of the clean bag. 7. Attach the clean bag to your leg with tape or a leg strap. Do not make the bag tight on your leg. 8. Wash your hands with soap and water. General rules   Never pull on your catheter. Never try to take it out. Doing that can hurt you.  Always wash your hands before and after you touch your catheter or bag. Use a mild, fragrance-free soap. If you do not have soap and water, use hand sanitizer.  Always make sure there are no twists or bends (kinks) in the catheter tube.  Always make sure there are no leaks in the catheter or bag.  Drink enough fluid to keep your pee pale yellow.  Do not take baths, swim, or use a hot tub.  If you are male, wipe from front to back after you poop (have a bowel movement). Contact a doctor if:  Your pee is cloudy.  Your pee smells worse than usual.  Your catheter gets clogged.  Your catheter leaks.  Your bladder feels full. Get help right away if:  You have redness, swelling, or pain where the catheter goes into your body.  You have fluid, blood, pus, or a bad smell coming from the area where the catheter goes into your body.  Your skin feels warm where the catheter goes into your body.  You have a fever.  You have pain in your: ? Belly (abdomen). ? Legs. ? Lower back. ? Bladder.  You see blood in the catheter.  Your pee is  pink or red.  You feel sick to your stomach (nauseous).  You throw up (vomit).  You have chills.  Your pee is not draining into the bag.  Your catheter gets pulled out. Summary  An indwelling urinary catheter is a thin tube that is placed into the bladder to help drain pee (urine) out of the body.  The catheter is placed into the part of the body that drains pee from the bladder (urethra).  Taking good care of your catheter will keep it working properly and help prevent problems.  Always wash your hands before and after touching your catheter or bag.  Never pull on your catheter or try to take it out. This information is not intended to replace advice given to you by your health care provider. Make sure you discuss any questions you have with your health care provider. Document Released: 05/26/2012 Document Revised: 07/22/2017 Document Reviewed: 09/14/2016 Elsevier Interactive Patient Education  2019 [employer].

## 2018-02-24 NOTE — Transfer of Care (Signed)
Immediate Anesthesia Transfer of Care Note  Patient: Joseph Kane  Procedure(s) Performed: RADIOACTIVE SEED IMPLANT/BRACHYTHERAPY IMPLANT (N/A Prostate) SPACE OAR INSTILLATION (N/A Rectum) CYSTOSCOPY (N/A Bladder)  Patient Location: PACU  Anesthesia Type:General  Level of Consciousness: drowsy  Airway & Oxygen Therapy: Patient Spontanous Breathing and Patient connected to face mask oxygen  Post-op Assessment: Report given to RN and Post -op Vital signs reviewed and stable  Post vital signs: Reviewed and stable  Last Vitals:  Vitals Value Taken Time  BP    Temp    Pulse    Resp    SpO2      Last Pain:  Vitals:   02/24/18 1155  TempSrc: Oral         Complications: No apparent anesthesia complications

## 2018-02-24 NOTE — H&P (Signed)
Urology Admission H&P  Chief Complaint: Prostate Cancer  History of Present Illness: Joseph Kane is a 68yo with a hx of T1c prostate cancer here for bachytherapy. No new LUTS. No hematuria or dysuria. No fevers/chills/sweats. No nausea/vomiting  Past Medical History:  Diagnosis Date  . Anemia   . Asthma    Hx: of as a child "outgrew"  . Cervical spondylosis    pt unaware  . DDD (degenerative disc disease), cervical    pt unaware  . History of colon polyps   . History of right inguinal hernia   . Prostate cancer [facility] Joseph Kane)    Past Surgical History:  Procedure Laterality Date  . COLONOSCOPY    . INGUINAL HERNIA REPAIR Right 01/14/2013   Procedure: HERNIA REPAIR INGUINAL ADULT;  Surgeon: Imogene Burn. Joseph Dover, MD;  Location: [facility];  Service: General;  Laterality: Right;  . INSERTION OF MESH Right 01/14/2013   Procedure: INSERTION OF MESH;  Surgeon: Imogene Burn. Joseph Dover, MD;  Location: [facility];  Service: General;  Laterality: Right;  . NO PAST SURGERIES    . UPPER GI ENDOSCOPY      Home Medications:  Current Facility-Administered Medications  Medication Dose Route Frequency Provider Last Rate Last Dose  . ceFAZolin (ANCEF) IVPB 2g/100 mL premix  2 g Intravenous Once Cleon Gustin, MD      . lactated ringers infusion   Intravenous Continuous Joseph Soman, MD 50 mL/hr at 02/24/18 1220    . [START ON 02/25/2018] sodium phosphate (FLEET) 7-19 GM/118ML enema 1 enema  1 enema Rectal Once Jennae Hakeem, Candee Furbish, MD       Allergies:  Allergies  Allergen Reactions  . Penicillins Other (See Comments)    Patient Blacks out Has patient had a PCN reaction causing immediate rash, facial/tongue/throat swelling, SOB or lightheadedness with hypotension: No Has patient had a PCN reaction causing severe rash involving mucus membranes or skin necrosis: No Has patient had a PCN reaction that required hospitalization No Has patient had a PCN reaction occurring within the last 10 years: No If all of the above  answers are "NO", then may proceed with Cephalosporin use.    Family History  Problem Relation Age of Onset  . Stroke Mother   . Prostate cancer Father   . Lupus Sister   . Heart disease Sister   . Breast cancer Neg Hx   . Colon cancer Neg Hx   . Pancreatic cancer Neg Hx    Social History:  reports that he quit smoking about 30 years ago. His smoking use included cigarettes. He has a 4.00 pack-year smoking history. He has never used smokeless tobacco. He reports that he does not drink alcohol or use drugs.  Review of Systems  All other systems reviewed and are negative.   Physical Exam:  Vital signs in last 24 hours: Temp:  [98.5 F (36.9 C)] 98.5 F (36.9 C) (01/13 1155) Pulse Rate:  [60] 60 (01/13 1155) Resp:  [16] 16 (01/13 1155) BP: (134)/(75) 134/75 (01/13 1155) SpO2:  [100 %] 100 % (01/13 1155) Weight:  [71.9 kg] 71.9 kg (01/13 1155) Physical Exam  Constitutional: He is oriented to person, place, and time. He appears well-developed and well-nourished.  HENT:  Head: Normocephalic and atraumatic.  Eyes: Pupils are equal, round, and reactive to light. EOM are normal.  Neck: Normal range of motion. No thyromegaly present.  Cardiovascular: Normal rate and regular rhythm.  Respiratory: Effort normal. No respiratory distress.  GI: Soft. He exhibits no distension.  Musculoskeletal: Normal range of motion.        General: No edema.  Neurological: He is alert and oriented to person, place, and time.  Skin: Skin is warm and dry.  Psychiatric: He has a normal mood and affect. His behavior is normal. Judgment and thought content normal.    Laboratory Data:  No results found for this or any previous visit (from the past 24 hour(s)). No results found for this or any previous visit (from the past 240 hour(s)). Creatinine: Recent Labs    02/17/18 1359  CREATININE 1.11   Baseline Creatinine: 1.1  Impression/Assessment:  67yo with Prostate Cancer  Plan:  The  risks/benefits/alternatives to brachytherapy with SpaceOAR was explained to the patient and he understands and wishes to proceed with surgery  Nicolette Bang 02/24/2018, 12:36 PM

## 2018-02-24 NOTE — Anesthesia Preprocedure Evaluation (Signed)
Anesthesia Evaluation  Patient identified by MRN, date of birth, ID band Patient awake    Reviewed: Allergy & Precautions, H&P , NPO status , Patient's Chart, lab work & pertinent test results, reviewed documented beta blocker date and time   Airway Mallampati: I  TM Distance: >3 FB Neck ROM: Full    Dental no notable dental hx. (+) Teeth Intact, Dental Advisory Given   Pulmonary former smoker,    Pulmonary exam normal breath sounds clear to auscultation       Cardiovascular negative cardio ROS Normal cardiovascular exam Rhythm:Regular Rate:Normal     Neuro/Psych negative neurological ROS  negative psych ROS   GI/Hepatic negative GI ROS, Neg liver ROS,   Endo/Other  negative endocrine ROS  Renal/GU negative Renal ROS  negative genitourinary   Musculoskeletal  (+) Arthritis ,   Abdominal (+)  Abdomen: soft. Bowel sounds: normal.  Peds negative pediatric ROS (+)  Hematology  (+) Blood dyscrasia, anemia ,   Anesthesia Other Findings   Reproductive/Obstetrics negative OB ROS                            Anesthesia Physical  Anesthesia Plan  ASA: I  Anesthesia Plan: General   Post-op Pain Management:    Induction: Intravenous  PONV Risk Score and Plan: 2 and Ondansetron, Treatment may vary due to age or medical condition and Dexamethasone  Airway Management Planned: LMA and Oral ETT  Additional Equipment:   Intra-op Plan:   Post-operative Plan: Extubation in OR  Informed Consent: I have reviewed the patients History and Physical, chart, labs and discussed the procedure including the risks, benefits and alternatives for the proposed anesthesia with the patient or authorized representative who has indicated his/her understanding and acceptance.   Dental advisory given  Plan Discussed with: CRNA, Anesthesiologist and Surgeon  Anesthesia Plan Comments: ( )         Anesthesia Quick Evaluation

## 2018-02-24 NOTE — Op Note (Signed)
PRE-OPERATIVE DIAGNOSIS:  Adenocarcinoma of the prostate  POST-OPERATIVE DIAGNOSIS:  Same  PROCEDURE:  Procedure(s): 1. I-125 radioactive seed implantation 2. Cystoscopy 3. SpaceOAR placement  SURGEON:  Surgeon(s): Patrick Mckenzie, MD  Radiation oncologist: Matthew Manning, MD  ANESTHESIA:  General  EBL:  Minimal  DRAINS: 16 French Foley catheter  INDICATION: Joseph Porzio is a 68 year old with a history of T1c prostate cancer. After discussing treatment options he has elected to proceed with brachytherapy and SpaceOAR  Description of procedure: After informed consent the patient was brought to the major OR, placed on the table and administered general anesthesia. He was then moved to the modified lithotomy position with his perineum perpendicular to the floor. His perineum and genitalia were then sterilely prepped. An official timeout was then performed. A 16 French Foley catheter was then placed in the bladder and filled with dilute contrast, a rectal tube was placed in the rectum and the transrectal ultrasound probe was placed in the rectum and affixed to the stand. He was then sterilely draped.  Real time ultrasonography was used along with the seed planning software [employer] Prostate vs. 4.2.21. This was used to develop the seed plan including the number of needles as well as number of seeds required for complete and adequate coverage. Real-time ultrasonography was then used along with the previously developed plan and the Nucletron device to implant a total of 84 seeds using 32 needles. This proceeded without difficulty or complication.  We then proceeded to mix the SpaceOAR using the kit supplied from the manufacturer. Once this was complete we placed a sinal needle into the perirectal fat between the rectum and the prostate. Once this was accomplished we injected 2cc of normal saline to hydrodissect the plain. We then instilled the the SpaceOAR through the spinal needle and noted  good distribution in the perirectal fat.    A Foley catheter was then removed as well as the transrectal ultrasound probe and rectal probe. Flexible cystoscopy was then performed using the 17 French flexible scope which revealed a normal urethra throughout its length down to the sphincter which appeared intact. The prostatic urethra revealed bilobar hypertrophy but no evidence of obstruction, seeds, spacers or lesions. The bladder was then entered and fully and systematically inspected. The ureteral orifices were noted to be of normal configuration and position. The mucosa revealed no evidence of tumors. There were also no stones identified within the bladder. I noted no seeds or spacers on the floor of the bladder and retroflexion of the scope revealed no seeds protruding from the base of the prostate.  The cystoscope was then removed and a new 16 French Foley catheter was then inserted and the balloon was filled with 10 cc of sterile water. This was connected to closed system drainage and the patient was awakened and taken to recovery room in stable and satisfactory condition. He tolerated procedure well and there were no intraoperative complications.   

## 2018-02-25 ENCOUNTER — Encounter (HOSPITAL_BASED_OUTPATIENT_CLINIC_OR_DEPARTMENT_OTHER): Payer: Self-pay | Admitting: Urology

## 2018-02-28 DIAGNOSIS — R338 Other retention of urine: Secondary | ICD-10-CM | POA: Diagnosis not present

## 2018-02-28 DIAGNOSIS — C61 Malignant neoplasm of prostate: Secondary | ICD-10-CM | POA: Diagnosis not present

## 2018-03-01 NOTE — Progress Notes (Signed)
  Radiation Oncology         [phone] ________________________________  Name: Naoki Migliaccio MRN: [id number]  Date: 03/01/2018  DOB: September 30, 1950       Prostate Seed Implant  YO:MAYOK, Myra Rude, MD  No ref. provider found  DIAGNOSIS: 68 y.o. gentleman with Stage T1c adenocarcinoma of the prostate with Gleason score of 4+3, and PSA of 4.9.    ICD-10-CM   1. Pre-op testing Z01.818 DG Chest 2 View    DG Chest 2 View    PROCEDURE: Insertion of radioactive I-125 seeds into the prostate gland.  RADIATION DOSE: 145 Gy, definitive therapy.  TECHNIQUE: Eliyas Suddreth was brought to the operating room with the urologist. He was placed in the dorsolithotomy position. He was catheterized and a rectal tube was inserted. The perineum was shaved, prepped and draped. The ultrasound probe was then introduced into the rectum to see the prostate gland.  TREATMENT DEVICE: A needle grid was attached to the ultrasound probe stand and anchor needles were placed.  3D PLANNING: The prostate was imaged in 3D using a sagittal sweep of the prostate probe. These images were transferred to the planning computer. There, the prostate, urethra and rectum were defined on each axial reconstructed image. Then, the software created an optimized 3D plan and a few seed positions were adjusted. The quality of the plan was reviewed using [facility] information for the target and the following two organs at risk:  Urethra and Rectum.  Then the accepted plan was printed and handed off to the radiation therapist.  Under my supervision, the custom loading of the seeds and spacers was carried out and loaded into sealed vicryl sleeves.  These pre-loaded needles were then placed into the needle holder.Marland Kitchen  PROSTATE VOLUME STUDY:  Using transrectal ultrasound the volume of the prostate was verified to be 46.7 cc.  SPECIAL TREATMENT PROCEDURE/SUPERVISION AND HANDLING: The pre-loaded needles were then delivered under sagittal guidance. A total of 32  needles were used to deposit 84 seeds in the prostate gland. The individual seed activity was 0.429 mCi.  SpaceOAR:  Yes  COMPLEX SIMULATION: At the end of the procedure, an anterior radiograph of the pelvis was obtained to document seed positioning and count. Cystoscopy was performed to check the urethra and bladder.  MICRODOSIMETRY: At the end of the procedure, the patient was emitting 0.39 mR/hr at 1 meter. Accordingly, he was considered safe for hospital discharge.  PLAN: The patient will return to the radiation oncology clinic for post implant CT dosimetry in three weeks.   ________________________________  Sheral Apley Tammi Klippel, M.D.

## 2018-03-05 ENCOUNTER — Telehealth: Payer: Self-pay | Admitting: *Deleted

## 2018-03-05 NOTE — Telephone Encounter (Signed)
CALLED PATIENT TO REMIND OF POST SEED APPTS. FOR 03/06/18, SPOKE WITH PATIENT AND HE IS AWARE OF THESE APPTS.

## 2018-03-06 ENCOUNTER — Other Ambulatory Visit: Payer: Self-pay

## 2018-03-06 ENCOUNTER — Ambulatory Visit
Admission: RE | Admit: 2018-03-06 | Discharge: 2018-03-06 | Disposition: A | Discharge status: Home / Self Care | Payer: Medicare HMO | Source: Ambulatory Visit | Attending: Radiation Oncology | Admitting: Radiation Oncology

## 2018-03-06 ENCOUNTER — Encounter: Payer: Self-pay | Admitting: Medical Oncology

## 2018-03-06 ENCOUNTER — Encounter: Payer: Self-pay | Admitting: Urology

## 2018-03-06 ENCOUNTER — Ambulatory Visit
Admission: RE | Admit: 2018-03-06 | Discharge: 2018-03-06 | Disposition: A | Discharge status: Home / Self Care | Payer: Medicare HMO | Source: Ambulatory Visit | Attending: Urology | Admitting: Urology

## 2018-03-06 VITALS — BP 127/84 | HR 67 | Temp 98.1°F | Resp 20

## 2018-03-06 DIAGNOSIS — C61 Malignant neoplasm of prostate: Secondary | ICD-10-CM

## 2018-03-06 DIAGNOSIS — Z79899 Other long term (current) drug therapy: Secondary | ICD-10-CM | POA: Diagnosis not present

## 2018-03-06 DIAGNOSIS — Z923 Personal history of irradiation: Secondary | ICD-10-CM | POA: Insufficient documentation

## 2018-03-06 DIAGNOSIS — Z7982 Long term (current) use of aspirin: Secondary | ICD-10-CM | POA: Diagnosis not present

## 2018-03-06 NOTE — Progress Notes (Signed)
Radiation Oncology         [phone]) [phone] ________________________________  Name: Joseph Kane MRN: [id number]  Date: 03/06/2018  DOB: [birth year]  Post-Seed Follow-Up Visit Note  CC: Lucianne Lei, MD  Cleon Gustin, MD  Diagnosis:   68 y.o. gentleman with Stage T1c adenocarcinoma of the prostate with Gleason score of 4+3, and PSA of 4.9.    ICD-10-CM   1. Malignant neoplasm of prostate (HCC) C67     Interval Since Last Radiation:  10 days 02/24/18:  Insertion of radioactive I-125 seeds into the prostate gland: 145 Gy, definitive therapy with placement of SpaceOAR gel.  Narrative:  The patient returns today for routine follow-up.  He is complaining of increased urinary frequency and urinary hesitation symptoms. He filled out a questionnaire regarding urinary function today providing and overall IPSS score of 11 characterizing his symptoms as moderate with mild increased daytime frequency, urgency and nocturia 3-4 times per night..  His pre-implant score was 2. He specifically denies dysuria, gross hematuria, straining to void, incomplete bladder emptying, intermittency or incontinence.  He reports a healthy appetite but admits that he has cut a lot of nutrients out of his diet based on information he has read on the Internet regarding foods that "you must avoid if you have prostate cancer".  We discussed this at length today and I advised that there is no scientific evidence to give prove that any particular food groups contribute to, cause or cure prostate cancer.  He denies any abdominal pain, nausea, vomiting, diarrhea or constipation.  Overall, he is quite pleased with his progress to date.  ALLERGIES:  is allergic to penicillins.  Meds: Current Outpatient Medications  Medication Sig Dispense Refill  . aspirin EC 81 MG tablet Take 81 mg by mouth daily.    . fish oil-omega-3 fatty acids 1000 MG capsule Take 1 g by mouth daily.     . Misc Natural Products (GINSENG COMPLEX PO) Take  by mouth.    . Misc Natural Products (PROSTATE THERAPY COMPLEX PO) Take by mouth.    . Multiple Vitamin (MULTIVITAMIN WITH MINERALS) TABS Take 1 tablet by mouth daily.     . traMADol (ULTRAM) 50 MG tablet Take 1 tablet (50 mg total) by mouth every 6 (six) hours as needed for moderate pain. 15 tablet 0   No current facility-administered medications for this visit.     Physical Findings: In general this is a well appearing African-American male in no acute distress.  He's alert and oriented x4 and appropriate throughout the examination. Cardiopulmonary assessment is negative for acute distress and he exhibits normal effort.   Lab Findings: Lab Results  Component Value Date   WBC 4.1 02/17/2018   HGB 12.3 (L) 02/17/2018   HCT 39.2 02/17/2018   MCV 91.6 02/17/2018   PLT 252 02/17/2018    Radiographic Findings:  Patient underwent CT imaging in our clinic for post implant dosimetry. The CT will be reviewed by Dr. Tammi Klippel to confirm an adequate distribution of radioactive seeds throughout the prostate gland and ensure that there are no seeds in or near the rectum.  He is scheduled for an MRI of the prostate at 10 AM on 03/08/2018 and these images will be fused with his CT images for further evaluation.  We suspect the final radiation plan and dosimetry will show appropriate coverage of the prostate gland and we will plan to contact the patient should there be any findings to indicate otherwise.   Impression/Plan: 68 y.o.  gentleman with Stage T1c adenocarcinoma of the prostate with Gleason score of 4+3, and PSA of 4.9. The patient is recovering from the effects of radiation. His urinary symptoms should gradually improve over the next 4-6 months. We talked about this today. He is encouraged by his improvement already and is otherwise pleased with his outcome. We also talked about long-term follow-up for prostate cancer following seed implant. He understands that ongoing PSA determinations and digital  rectal exams will help perform surveillance to rule out disease recurrence. He has a follow up appointment scheduled with Azucena Fallen, NP on 03/14/2018 and anticipates a follow-up with Dr. Alyson Ingles in April 2020. He understands what to expect with his PSA measures. Patient was also educated today about some of the long-term effects from radiation including a small risk for rectal bleeding and possibly erectile dysfunction. We talked about some of the general management approaches to these potential complications. However, I did encourage the patient to contact our office or return at any point if he has questions or concerns related to his previous radiation and prostate cancer.    Nicholos Johns, PA-C

## 2018-03-06 NOTE — Progress Notes (Addendum)
Mr. Joseph Kane is here today for a follow-up appointment today . He has a MRI scheduled for 03/08/18 . He had an appointment with his urologist on 02/17/18. Patient reports some dysuria and hematuria. Patients IPPS  score was 11. Patient denies any leakage. Patient reports some urgency with urination.Patient denies any issue with his bowels. Vitals:   03/06/18 1433  BP: 127/84  Pulse: 67  Resp: 20  Temp: 98.1 F (36.7 C)  TempSrc: Oral  SpO2: 100%   Wt Readings from Last 3 Encounters:  02/24/18 158 lb 9.6 oz (71.9 kg)  12/18/17 164 lb 2 oz (74.4 kg)  04/16/16 170 lb (77.1 kg)

## 2018-03-08 ENCOUNTER — Ambulatory Visit (HOSPITAL_COMMUNITY)
Admission: RE | Admit: 2018-03-08 | Discharge: 2018-03-08 | Disposition: A | Discharge status: Home / Self Care | Payer: Medicare HMO | Source: Ambulatory Visit | Attending: Urology | Admitting: Urology

## 2018-03-08 ENCOUNTER — Other Ambulatory Visit: Payer: Self-pay | Admitting: Urology

## 2018-03-08 DIAGNOSIS — Z77018 Contact with and (suspected) exposure to other hazardous metals: Secondary | ICD-10-CM

## 2018-03-08 DIAGNOSIS — Z135 Encounter for screening for eye and ear disorders: Secondary | ICD-10-CM | POA: Diagnosis not present

## 2018-03-08 DIAGNOSIS — C61 Malignant neoplasm of prostate: Secondary | ICD-10-CM

## 2018-03-08 NOTE — Progress Notes (Signed)
  Radiation Oncology         [phone]) [phone] ________________________________  Name: Joseph Kane MRN: [id number]  Date: 03/06/2018  DOB: [birth year]  COMPLEX SIMULATION NOTE  NARRATIVE:  The patient was brought to the [facility] today following prostate seed implantation approximately one month ago.  Identity was confirmed.  All relevant records and images related to the planned course of therapy were reviewed.  Then, the patient was set-up supine.  CT images were obtained.  The CT images were loaded into the planning software.  Then the prostate and rectum were contoured.  Treatment planning then occurred.  The implanted iodine 125 seeds were identified by the physics staff for projection of radiation distribution  I have requested : 3D Simulation  I have requested a DVH of the following structures: Prostate and rectum.    ________________________________  Sheral Apley Tammi Klippel, M.D.

## 2018-03-17 DIAGNOSIS — C61 Malignant neoplasm of prostate: Secondary | ICD-10-CM | POA: Diagnosis not present

## 2018-03-28 ENCOUNTER — Encounter: Payer: Self-pay | Admitting: Radiation Oncology

## 2018-03-28 ENCOUNTER — Ambulatory Visit: Payer: Medicare HMO | Attending: Radiation Oncology | Admitting: Radiation Oncology

## 2018-03-28 DIAGNOSIS — C61 Malignant neoplasm of prostate: Secondary | ICD-10-CM | POA: Diagnosis not present

## 2018-04-08 ENCOUNTER — Encounter: Payer: Self-pay | Admitting: Radiation Oncology

## 2018-04-08 NOTE — Progress Notes (Signed)
  Radiation Oncology         [phone]) [phone] ________________________________  Name: Joseph Kane MRN: [id number]  Date: 03/28/2018  DOB: 1950-07-02  3D Planning Note   Prostate Brachytherapy Post-Implant Dosimetry  Diagnosis: 68 y.o. gentleman with Stage T1c adenocarcinoma of the prostate with Gleason score of 4+3, and PSA of 4.9.  Narrative: On a previous date, Joseph Kane returned following prostate seed implantation for post implant planning. He underwent CT scan complex simulation to delineate the three-dimensional structures of the pelvis and demonstrate the radiation distribution.  Since that time, the seed localization, and complex isodose planning with dose volume histograms have now been completed.  Results:   Prostate Coverage - The dose of radiation delivered to the 90% or more of the prostate gland (D90) was 91.56% of the prescription dose. This exceeds our goal of greater than 90%. Rectal Sparing - The volume of rectal tissue receiving the prescription dose or higher was 0.0 cc. This falls under our thresholds tolerance of 1.0 cc.  Impression: The prostate seed implant appears to show adequate target coverage and appropriate rectal sparing.  Plan:  The patient will continue to follow with urology for ongoing PSA determinations. I would anticipate a high likelihood for local tumor control with minimal risk for rectal morbidity.  ________________________________  Sheral Apley Tammi Klippel, M.D.

## 2018-06-13 DIAGNOSIS — C61 Malignant neoplasm of prostate: Secondary | ICD-10-CM | POA: Diagnosis not present

## 2018-07-11 DIAGNOSIS — C61 Malignant neoplasm of prostate: Secondary | ICD-10-CM | POA: Diagnosis not present

## 2018-08-22 ENCOUNTER — Ambulatory Visit: Payer: Medicare HMO | Admitting: Podiatry

## 2018-09-11 DIAGNOSIS — C61 Malignant neoplasm of prostate: Secondary | ICD-10-CM | POA: Diagnosis not present

## 2018-09-19 DIAGNOSIS — C61 Malignant neoplasm of prostate: Secondary | ICD-10-CM | POA: Diagnosis not present

## 2018-09-19 DIAGNOSIS — N3000 Acute cystitis without hematuria: Secondary | ICD-10-CM | POA: Diagnosis not present

## 2018-11-04 ENCOUNTER — Telehealth: Payer: Self-pay | Admitting: Radiation Oncology

## 2018-11-04 NOTE — Telephone Encounter (Signed)
Received a message from [place] that the patient's wife called. Phoned her back to inquire. She questions if her husband is safe to travel. Explained from a radiation standpoint he is fine to travel since his seed implant in January 2020. She questions if its safe to travel with COVID. I explained that the recommendations are to avoid crowds, wear a facemask covering mouth and nose, maintain social distance, and wash hands often. Reinforced from a radiation standpoint and s/p seed implant her husband is safe to travel. She verbalized understanding.

## 2019-03-06 DIAGNOSIS — C61 Malignant neoplasm of prostate: Secondary | ICD-10-CM | POA: Diagnosis not present

## 2019-04-13 ENCOUNTER — Ambulatory Visit: Payer: Medicare HMO | Attending: Internal Medicine

## 2019-04-13 DIAGNOSIS — Z23 Encounter for immunization: Secondary | ICD-10-CM

## 2019-04-13 NOTE — Progress Notes (Signed)
   Covid-19 Vaccination Clinic  Name:  Joseph Kane    MRN: [id number] DOB: [birth year]  04/13/2019  Joseph Kane was observed post Covid-19 immunization for 15 minutes without incidence. He was provided with Vaccine Information Sheet and instruction to access the V-Safe system.   Joseph Kane was instructed to call 911 with any severe reactions post vaccine: Marland Kitchen Difficulty breathing  . Swelling of your face and throat  . A fast heartbeat  . A bad rash all over your body  . Dizziness and weakness    Immunizations Administered    Name Date Dose VIS Date Route   Pfizer COVID-19 Vaccine 04/13/2019  8:47 AM 0.3 mL 01/23/2019 Intramuscular   Manufacturer: [place]   Lot: [id number]   [place]: [id number]

## 2019-04-23 ENCOUNTER — Emergency Department (HOSPITAL_COMMUNITY)
Admission: EM | Admit: 2019-04-23 | Discharge: 2019-04-23 | Disposition: A | Discharge status: Home / Self Care | Payer: Medicare HMO | Attending: Emergency Medicine | Admitting: Emergency Medicine

## 2019-04-23 ENCOUNTER — Emergency Department (HOSPITAL_COMMUNITY): Payer: Medicare HMO

## 2019-04-23 ENCOUNTER — Other Ambulatory Visit: Payer: Self-pay

## 2019-04-23 DIAGNOSIS — Z87891 Personal history of nicotine dependence: Secondary | ICD-10-CM | POA: Diagnosis not present

## 2019-04-23 DIAGNOSIS — Z8546 Personal history of malignant neoplasm of prostate: Secondary | ICD-10-CM | POA: Diagnosis not present

## 2019-04-23 DIAGNOSIS — M25512 Pain in left shoulder: Secondary | ICD-10-CM | POA: Insufficient documentation

## 2019-04-23 DIAGNOSIS — M79602 Pain in left arm: Secondary | ICD-10-CM | POA: Diagnosis not present

## 2019-04-23 DIAGNOSIS — Y998 Other external cause status: Secondary | ICD-10-CM | POA: Insufficient documentation

## 2019-04-23 DIAGNOSIS — S0990XA Unspecified injury of head, initial encounter: Secondary | ICD-10-CM | POA: Diagnosis present

## 2019-04-23 DIAGNOSIS — Y9389 Activity, other specified: Secondary | ICD-10-CM | POA: Insufficient documentation

## 2019-04-23 DIAGNOSIS — Y92410 Unspecified street and highway as the place of occurrence of the external cause: Secondary | ICD-10-CM | POA: Diagnosis not present

## 2019-04-23 DIAGNOSIS — W2210XA Striking against or struck by unspecified automobile airbag, initial encounter: Secondary | ICD-10-CM | POA: Insufficient documentation

## 2019-04-23 DIAGNOSIS — S4992XA Unspecified injury of left shoulder and upper arm, initial encounter: Secondary | ICD-10-CM | POA: Diagnosis not present

## 2019-04-23 DIAGNOSIS — M79622 Pain in left upper arm: Secondary | ICD-10-CM | POA: Insufficient documentation

## 2019-04-23 DIAGNOSIS — T148XXA Other injury of unspecified body region, initial encounter: Secondary | ICD-10-CM | POA: Diagnosis not present

## 2019-04-23 DIAGNOSIS — S40012A Contusion of left shoulder, initial encounter: Secondary | ICD-10-CM | POA: Diagnosis not present

## 2019-04-23 NOTE — ED Notes (Signed)
An After Visit Summary was printed and given to the patient. Discharge instructions given and no further questions at this time.  
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
## 2019-04-23 NOTE — Discharge Instructions (Addendum)
You were evaluated in the Emergency Department and after careful evaluation, we did not find any emergent condition requiring admission or further testing in the hospital.  Your exam/testing today is overall reassuring.  Your x-rays did not show any broken bones.  We recommend Tylenol 1000 mg every 4-6 hours and/or ibuprofen 600 mg every 4-6 hours for pain.  You will likely be more sore tomorrow.  Please return to the Emergency Department if you experience any worsening of your condition.  We encourage you to follow up with a primary care provider.  Thank you for allowing [place] to be a part of your care.
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
## 2019-05-04 DIAGNOSIS — E785 Hyperlipidemia, unspecified: Secondary | ICD-10-CM | POA: Diagnosis not present

## 2019-05-04 DIAGNOSIS — M25512 Pain in left shoulder: Secondary | ICD-10-CM | POA: Diagnosis not present

## 2019-05-04 DIAGNOSIS — M25511 Pain in right shoulder: Secondary | ICD-10-CM | POA: Diagnosis not present

## 2019-05-04 DIAGNOSIS — M25559 Pain in unspecified hip: Secondary | ICD-10-CM | POA: Diagnosis not present

## 2019-05-12 ENCOUNTER — Ambulatory Visit: Payer: Medicare HMO | Attending: Internal Medicine

## 2019-05-12 DIAGNOSIS — Z23 Encounter for immunization: Secondary | ICD-10-CM

## 2019-05-12 NOTE — Progress Notes (Signed)
   Covid-19 Vaccination Clinic  Name:  Joseph Kane    MRN: [id number] DOB: [birth year]  05/12/2019  [name] was observed post Covid-19 immunization for 15 minutes without incident. He was provided with Vaccine Information Sheet and instruction to access the V-Safe system.   [name] was instructed to call 911 with any severe reactions post vaccine: Marland Kitchen Difficulty breathing  . Swelling of face and throat  . A fast heartbeat  . A bad rash all over body  . Dizziness and weakness   Immunizations Administered    Name Date Dose VIS Date Route   Pfizer COVID-19 Vaccine 05/12/2019  9:21 AM 0.3 mL 01/23/2019 Intramuscular   Manufacturer: [place]   Lot: [id number]   [place]: [id number]

## 2019-05-26 DIAGNOSIS — M25552 Pain in left hip: Secondary | ICD-10-CM | POA: Diagnosis not present

## 2019-05-26 DIAGNOSIS — M79602 Pain in left arm: Secondary | ICD-10-CM | POA: Diagnosis not present

## 2019-05-26 DIAGNOSIS — M25561 Pain in right knee: Secondary | ICD-10-CM | POA: Diagnosis not present

## 2019-06-01 ENCOUNTER — Ambulatory Visit
Admission: RE | Admit: 2019-06-01 | Discharge: 2019-06-01 | Disposition: A | Discharge status: Home / Self Care | Payer: Medicare HMO | Source: Ambulatory Visit | Attending: Family Medicine | Admitting: Family Medicine

## 2019-06-01 ENCOUNTER — Other Ambulatory Visit: Payer: Self-pay | Admitting: Family Medicine

## 2019-06-01 ENCOUNTER — Other Ambulatory Visit: Payer: Self-pay

## 2019-06-01 DIAGNOSIS — H25813 Combined forms of age-related cataract, bilateral: Secondary | ICD-10-CM | POA: Diagnosis not present

## 2019-06-01 DIAGNOSIS — M25561 Pain in right knee: Secondary | ICD-10-CM

## 2019-06-01 DIAGNOSIS — M25552 Pain in left hip: Secondary | ICD-10-CM

## 2019-06-01 DIAGNOSIS — S79912A Unspecified injury of left hip, initial encounter: Secondary | ICD-10-CM | POA: Diagnosis not present

## 2019-06-01 DIAGNOSIS — H43812 Vitreous degeneration, left eye: Secondary | ICD-10-CM | POA: Diagnosis not present

## 2019-06-09 DIAGNOSIS — M25512 Pain in left shoulder: Secondary | ICD-10-CM | POA: Diagnosis not present

## 2019-06-09 DIAGNOSIS — M25561 Pain in right knee: Secondary | ICD-10-CM | POA: Diagnosis not present

## 2019-06-09 DIAGNOSIS — M25552 Pain in left hip: Secondary | ICD-10-CM | POA: Diagnosis not present

## 2019-07-14 DIAGNOSIS — M25552 Pain in left hip: Secondary | ICD-10-CM | POA: Diagnosis not present

## 2019-07-14 DIAGNOSIS — M25561 Pain in right knee: Secondary | ICD-10-CM | POA: Diagnosis not present

## 2019-07-14 DIAGNOSIS — M25512 Pain in left shoulder: Secondary | ICD-10-CM | POA: Diagnosis not present

## 2019-08-11 DIAGNOSIS — M25552 Pain in left hip: Secondary | ICD-10-CM | POA: Diagnosis not present

## 2019-08-11 DIAGNOSIS — M25512 Pain in left shoulder: Secondary | ICD-10-CM | POA: Diagnosis not present

## 2019-08-11 DIAGNOSIS — M25561 Pain in right knee: Secondary | ICD-10-CM | POA: Diagnosis not present

## 2019-08-11 DIAGNOSIS — Z0000 Encounter for general adult medical examination without abnormal findings: Secondary | ICD-10-CM | POA: Diagnosis not present

## 2019-09-14 DIAGNOSIS — M25552 Pain in left hip: Secondary | ICD-10-CM | POA: Diagnosis not present

## 2019-09-14 DIAGNOSIS — M25512 Pain in left shoulder: Secondary | ICD-10-CM | POA: Diagnosis not present

## 2019-09-14 DIAGNOSIS — M25561 Pain in right knee: Secondary | ICD-10-CM | POA: Diagnosis not present

## 2019-09-30 DIAGNOSIS — C61 Malignant neoplasm of prostate: Secondary | ICD-10-CM | POA: Diagnosis not present

## 2019-10-05 DIAGNOSIS — M25552 Pain in left hip: Secondary | ICD-10-CM | POA: Diagnosis not present

## 2019-10-05 DIAGNOSIS — M25561 Pain in right knee: Secondary | ICD-10-CM | POA: Diagnosis not present

## 2019-10-05 DIAGNOSIS — M545 Low back pain: Secondary | ICD-10-CM | POA: Diagnosis not present

## 2019-10-12 DIAGNOSIS — M545 Low back pain: Secondary | ICD-10-CM | POA: Diagnosis not present

## 2019-10-12 DIAGNOSIS — M25561 Pain in right knee: Secondary | ICD-10-CM | POA: Diagnosis not present

## 2019-10-12 DIAGNOSIS — M25552 Pain in left hip: Secondary | ICD-10-CM | POA: Diagnosis not present

## 2019-10-21 DIAGNOSIS — M545 Low back pain: Secondary | ICD-10-CM | POA: Diagnosis not present

## 2019-10-21 DIAGNOSIS — M25561 Pain in right knee: Secondary | ICD-10-CM | POA: Diagnosis not present

## 2019-10-21 DIAGNOSIS — M25552 Pain in left hip: Secondary | ICD-10-CM | POA: Diagnosis not present

## 2019-10-26 DIAGNOSIS — M545 Low back pain: Secondary | ICD-10-CM | POA: Diagnosis not present

## 2019-10-26 DIAGNOSIS — M25552 Pain in left hip: Secondary | ICD-10-CM | POA: Diagnosis not present

## 2019-10-26 DIAGNOSIS — M25561 Pain in right knee: Secondary | ICD-10-CM | POA: Diagnosis not present

## 2019-11-02 DIAGNOSIS — M25561 Pain in right knee: Secondary | ICD-10-CM | POA: Diagnosis not present

## 2019-11-02 DIAGNOSIS — M25552 Pain in left hip: Secondary | ICD-10-CM | POA: Diagnosis not present

## 2019-11-02 DIAGNOSIS — M545 Low back pain: Secondary | ICD-10-CM | POA: Diagnosis not present

## 2019-12-14 DIAGNOSIS — H25813 Combined forms of age-related cataract, bilateral: Secondary | ICD-10-CM | POA: Diagnosis not present

## 2019-12-14 DIAGNOSIS — H43812 Vitreous degeneration, left eye: Secondary | ICD-10-CM | POA: Diagnosis not present

## 2020-01-12 IMAGING — DX DG ORBITS FOR FOREIGN BODY
2 series · 2 of 2 positions shown · non-contrast
Comparison: None.

CLINICAL DATA: Metal working/exposure; clearance prior to MRI

EXAM:
ORBITS FOR FOREIGN BODY - 2 VIEW

[orbits waters (1 of 2)]
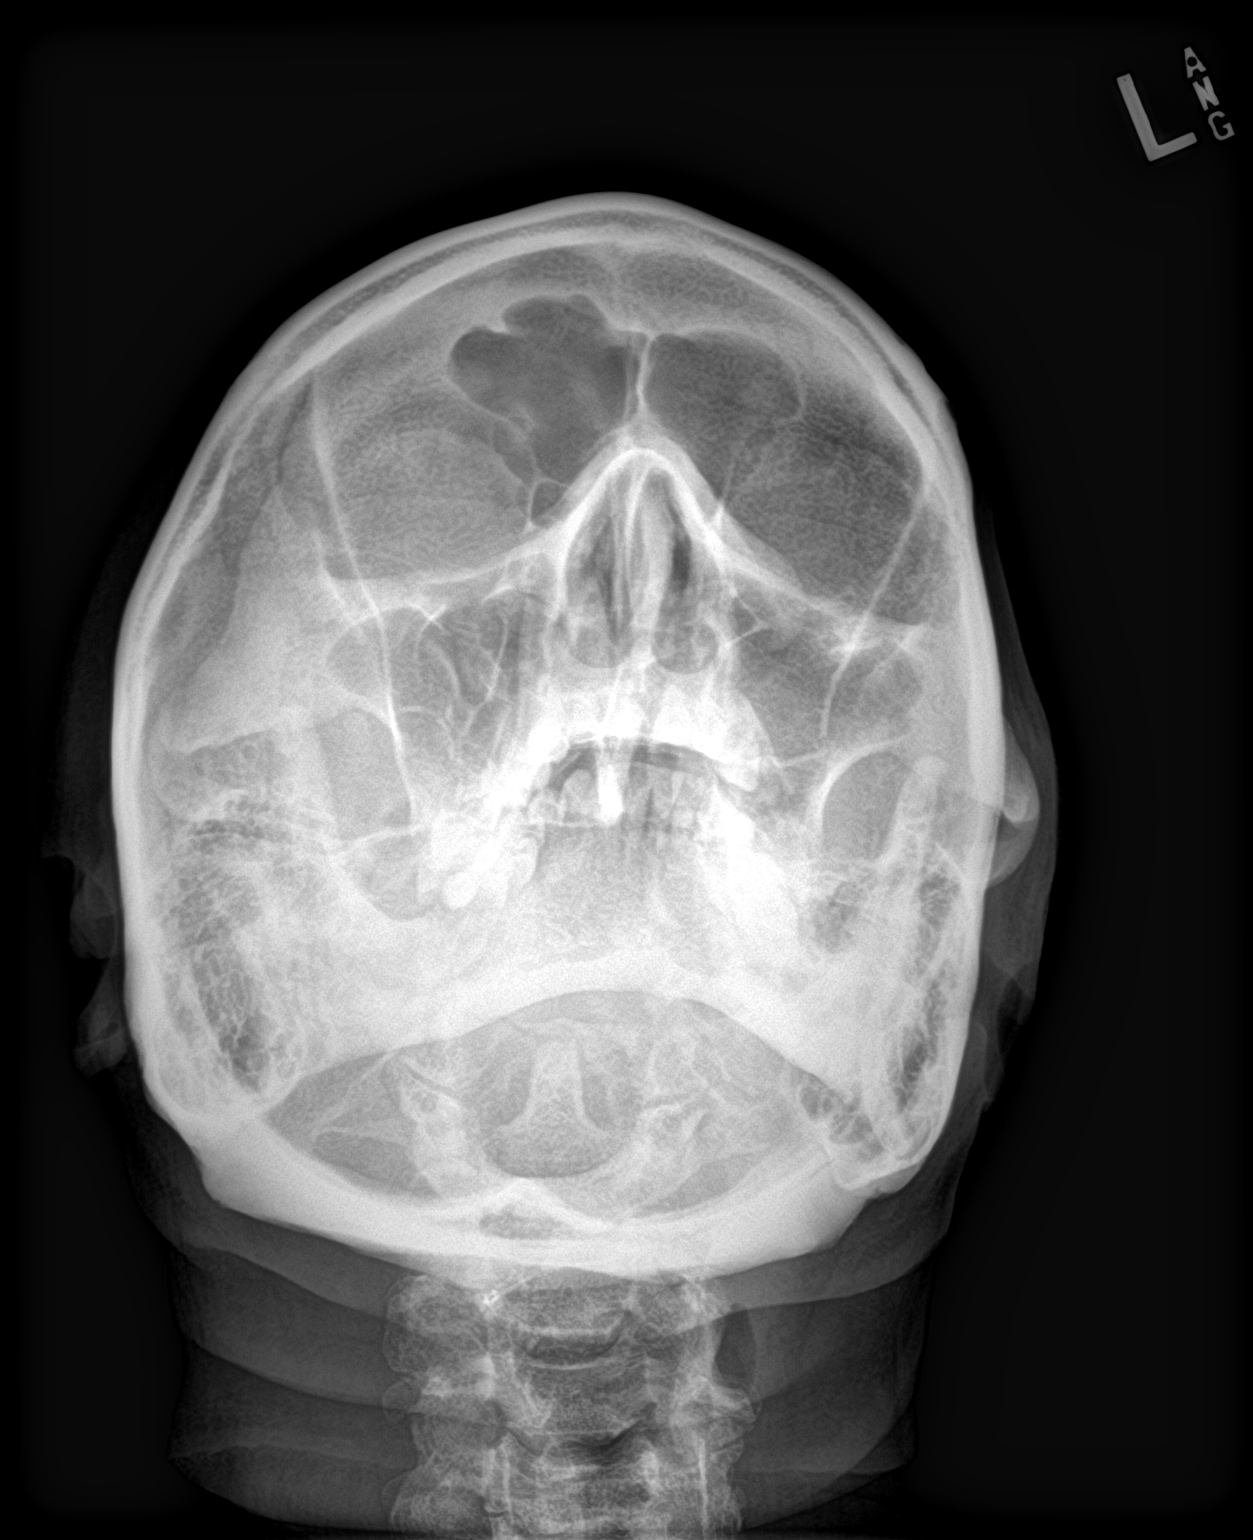

[orbits waters (2 of 2)]
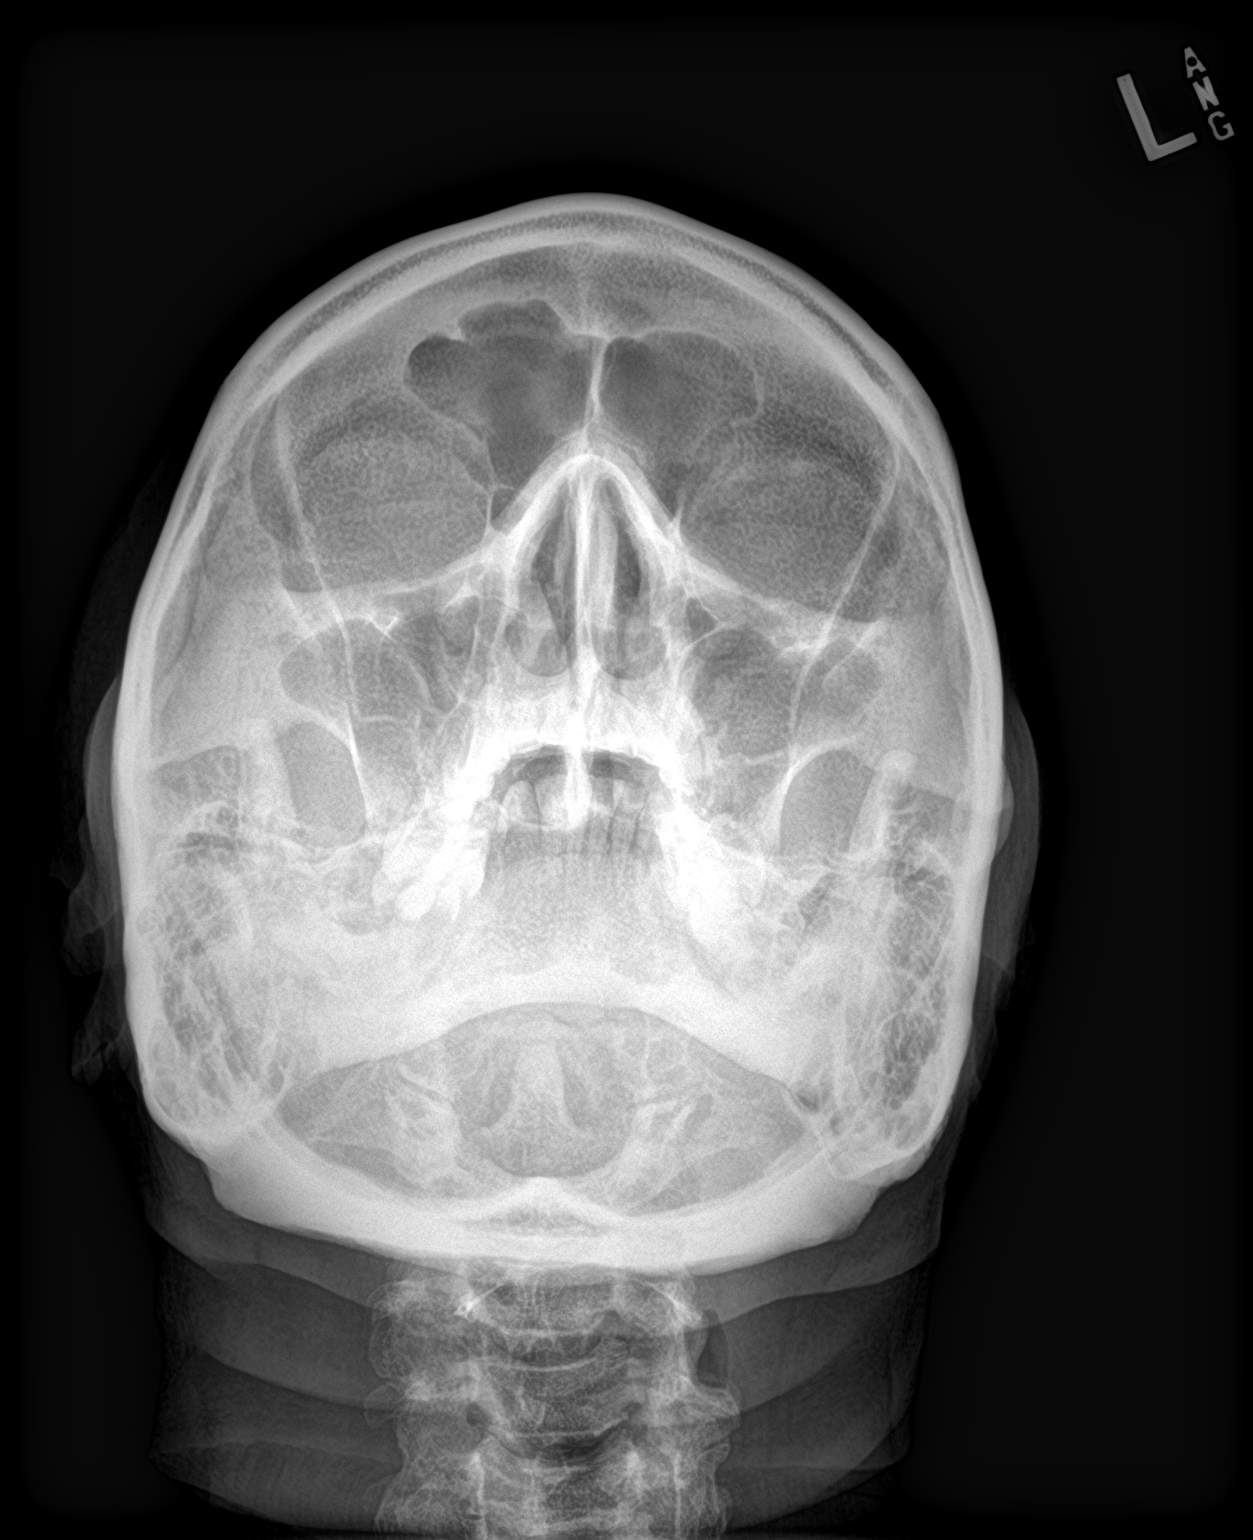

[2 of 2 positions shown; findings below may reference images not displayed]

FINDINGS: There is no evidence of metallic foreign body within the orbits. No
significant bone abnormality identified.
IMPRESSION: No evidence of metallic foreign body within the orbits.

## 2020-01-19 DIAGNOSIS — G44311 Acute post-traumatic headache, intractable: Secondary | ICD-10-CM | POA: Diagnosis not present

## 2020-01-19 DIAGNOSIS — M25512 Pain in left shoulder: Secondary | ICD-10-CM | POA: Diagnosis not present

## 2020-01-19 DIAGNOSIS — G44319 Acute post-traumatic headache, not intractable: Secondary | ICD-10-CM | POA: Diagnosis not present

## 2020-01-19 DIAGNOSIS — M25561 Pain in right knee: Secondary | ICD-10-CM | POA: Diagnosis not present

## 2020-01-19 DIAGNOSIS — E785 Hyperlipidemia, unspecified: Secondary | ICD-10-CM | POA: Diagnosis not present

## 2020-01-19 DIAGNOSIS — M25552 Pain in left hip: Secondary | ICD-10-CM | POA: Diagnosis not present

## 2020-03-28 DIAGNOSIS — G44311 Acute post-traumatic headache, intractable: Secondary | ICD-10-CM | POA: Diagnosis not present

## 2020-03-28 DIAGNOSIS — M25552 Pain in left hip: Secondary | ICD-10-CM | POA: Diagnosis not present

## 2020-03-28 DIAGNOSIS — M25512 Pain in left shoulder: Secondary | ICD-10-CM | POA: Diagnosis not present

## 2020-03-28 DIAGNOSIS — M25561 Pain in right knee: Secondary | ICD-10-CM | POA: Diagnosis not present

## 2020-05-04 DIAGNOSIS — C61 Malignant neoplasm of prostate: Secondary | ICD-10-CM | POA: Diagnosis not present

## 2020-05-09 DIAGNOSIS — C61 Malignant neoplasm of prostate: Secondary | ICD-10-CM | POA: Diagnosis not present

## 2020-07-07 DIAGNOSIS — M130 Polyarthritis, unspecified: Secondary | ICD-10-CM | POA: Diagnosis not present

## 2020-07-07 DIAGNOSIS — E782 Mixed hyperlipidemia: Secondary | ICD-10-CM | POA: Diagnosis not present

## 2020-07-07 DIAGNOSIS — G619 Inflammatory polyneuropathy, unspecified: Secondary | ICD-10-CM | POA: Diagnosis not present

## 2020-07-07 DIAGNOSIS — I10 Essential (primary) hypertension: Secondary | ICD-10-CM | POA: Diagnosis not present

## 2020-07-07 DIAGNOSIS — C61 Malignant neoplasm of prostate: Secondary | ICD-10-CM | POA: Diagnosis not present

## 2020-10-03 DIAGNOSIS — M542 Cervicalgia: Secondary | ICD-10-CM | POA: Diagnosis not present

## 2020-10-03 DIAGNOSIS — E785 Hyperlipidemia, unspecified: Secondary | ICD-10-CM | POA: Diagnosis not present

## 2020-10-03 DIAGNOSIS — H6122 Impacted cerumen, left ear: Secondary | ICD-10-CM | POA: Diagnosis not present

## 2020-10-03 DIAGNOSIS — C61 Malignant neoplasm of prostate: Secondary | ICD-10-CM | POA: Diagnosis not present

## 2020-10-31 DIAGNOSIS — C61 Malignant neoplasm of prostate: Secondary | ICD-10-CM | POA: Diagnosis not present

## 2020-12-12 DIAGNOSIS — Z23 Encounter for immunization: Secondary | ICD-10-CM | POA: Diagnosis not present

## 2020-12-12 DIAGNOSIS — Z0000 Encounter for general adult medical examination without abnormal findings: Secondary | ICD-10-CM | POA: Diagnosis not present

## 2020-12-12 DIAGNOSIS — C61 Malignant neoplasm of prostate: Secondary | ICD-10-CM | POA: Diagnosis not present

## 2020-12-12 DIAGNOSIS — E785 Hyperlipidemia, unspecified: Secondary | ICD-10-CM | POA: Diagnosis not present

## 2020-12-14 DIAGNOSIS — H25813 Combined forms of age-related cataract, bilateral: Secondary | ICD-10-CM | POA: Diagnosis not present

## 2020-12-14 DIAGNOSIS — H43812 Vitreous degeneration, left eye: Secondary | ICD-10-CM | POA: Diagnosis not present

## 2021-03-27 DIAGNOSIS — E785 Hyperlipidemia, unspecified: Secondary | ICD-10-CM | POA: Diagnosis not present

## 2021-06-26 DIAGNOSIS — E785 Hyperlipidemia, unspecified: Secondary | ICD-10-CM | POA: Diagnosis not present

## 2021-06-26 DIAGNOSIS — C61 Malignant neoplasm of prostate: Secondary | ICD-10-CM | POA: Diagnosis not present

## 2021-06-29 ENCOUNTER — Other Ambulatory Visit: Payer: Self-pay | Admitting: Family Medicine

## 2021-06-29 DIAGNOSIS — Z87891 Personal history of nicotine dependence: Secondary | ICD-10-CM

## 2021-09-26 DIAGNOSIS — E785 Hyperlipidemia, unspecified: Secondary | ICD-10-CM | POA: Diagnosis not present

## 2021-09-26 DIAGNOSIS — Z6822 Body mass index (BMI) 22.0-22.9, adult: Secondary | ICD-10-CM | POA: Diagnosis not present

## 2021-09-26 DIAGNOSIS — R634 Abnormal weight loss: Secondary | ICD-10-CM | POA: Diagnosis not present

## 2022-04-09 DIAGNOSIS — E782 Mixed hyperlipidemia: Secondary | ICD-10-CM | POA: Diagnosis not present

## 2022-04-09 DIAGNOSIS — Z0000 Encounter for general adult medical examination without abnormal findings: Secondary | ICD-10-CM | POA: Diagnosis not present

## 2022-04-09 DIAGNOSIS — E7800 Pure hypercholesterolemia, unspecified: Secondary | ICD-10-CM | POA: Diagnosis not present

## 2022-07-26 DIAGNOSIS — C61 Malignant neoplasm of prostate: Secondary | ICD-10-CM | POA: Diagnosis not present

## 2023-01-21 DIAGNOSIS — C61 Malignant neoplasm of prostate: Secondary | ICD-10-CM | POA: Diagnosis not present

## 2023-05-20 DIAGNOSIS — E782 Mixed hyperlipidemia: Secondary | ICD-10-CM | POA: Diagnosis not present

## 2023-11-19 DIAGNOSIS — E7800 Pure hypercholesterolemia, unspecified: Secondary | ICD-10-CM | POA: Diagnosis not present
